# Patient Record
Sex: Female | Born: 1980 | Hispanic: No | Marital: Married | Smoking: Never smoker
Health system: Southern US, Community
[De-identification: ages and names within clinical notes are randomized; demographics above are authoritative.]

## PROBLEM LIST (undated history)

## (undated) ENCOUNTER — Inpatient Hospital Stay (HOSPITAL_COMMUNITY): Payer: Self-pay

## (undated) DIAGNOSIS — Z789 Other specified health status: Secondary | ICD-10-CM

## (undated) HISTORY — DX: Other specified health status: Z78.9

## (undated) HISTORY — PX: NO PAST SURGERIES: SHX2092

---

## 2008-01-29 ENCOUNTER — Encounter: Payer: Self-pay | Admitting: Obstetrics and Gynecology

## 2008-01-29 ENCOUNTER — Ambulatory Visit: Payer: Self-pay | Admitting: Obstetrics & Gynecology

## 2008-02-05 ENCOUNTER — Ambulatory Visit: Payer: Self-pay | Admitting: Obstetrics and Gynecology

## 2008-02-05 ENCOUNTER — Ambulatory Visit: Payer: Self-pay | Admitting: Obstetrics & Gynecology

## 2008-02-05 ENCOUNTER — Inpatient Hospital Stay (HOSPITAL_COMMUNITY): Admission: RE | Admit: 2008-02-05 | Discharge: 2008-02-07 | Payer: Self-pay | Admitting: Family Medicine

## 2008-02-25 ENCOUNTER — Inpatient Hospital Stay (HOSPITAL_COMMUNITY): Admission: AD | Admit: 2008-02-25 | Discharge: 2008-02-25 | Payer: Self-pay | Admitting: Gynecology

## 2008-02-25 ENCOUNTER — Ambulatory Visit: Payer: Self-pay | Admitting: *Deleted

## 2011-09-01 LAB — CBC
HCT: 31.2 — ABNORMAL LOW
Hemoglobin: 10.2 — ABNORMAL LOW
Platelets: 153
RBC: 4.19
RBC: 4.97
WBC: 10.6 — ABNORMAL HIGH
WBC: 8.4

## 2011-09-01 LAB — POCT URINALYSIS DIP (DEVICE)
Glucose, UA: NEGATIVE
Glucose, UA: NEGATIVE
Nitrite: NEGATIVE
Nitrite: NEGATIVE
Operator id: 148111
Operator id: 148111
Specific Gravity, Urine: 1.015
Specific Gravity, Urine: 1.025
Urobilinogen, UA: 0.2
Urobilinogen, UA: 0.2

## 2011-09-01 LAB — RPR: RPR Ser Ql: NONREACTIVE

## 2011-10-16 ENCOUNTER — Encounter (HOSPITAL_COMMUNITY): Payer: Self-pay | Admitting: *Deleted

## 2011-10-16 ENCOUNTER — Inpatient Hospital Stay (HOSPITAL_COMMUNITY)
Admission: AD | Admit: 2011-10-16 | Discharge: 2011-10-16 | Disposition: A | Discharge status: Home / Self Care | Payer: Medicaid Other | Source: Ambulatory Visit | Attending: Obstetrics & Gynecology | Admitting: Obstetrics & Gynecology

## 2011-10-16 DIAGNOSIS — R10814 Left lower quadrant abdominal tenderness: Secondary | ICD-10-CM

## 2011-10-16 DIAGNOSIS — O99891 Other specified diseases and conditions complicating pregnancy: Secondary | ICD-10-CM | POA: Insufficient documentation

## 2011-10-16 DIAGNOSIS — O0930 Supervision of pregnancy with insufficient antenatal care, unspecified trimester: Secondary | ICD-10-CM | POA: Insufficient documentation

## 2011-10-16 DIAGNOSIS — R1032 Left lower quadrant pain: Secondary | ICD-10-CM | POA: Insufficient documentation

## 2011-10-16 LAB — CBC
Hemoglobin: 9.7 g/dL — ABNORMAL LOW (ref 12.0–15.0)
MCH: 22.6 pg — ABNORMAL LOW (ref 26.0–34.0)
MCHC: 31.6 g/dL (ref 30.0–36.0)
Platelets: 131 10*3/uL — ABNORMAL LOW (ref 150–400)
RBC: 4.29 MIL/uL (ref 3.87–5.11)

## 2011-10-16 LAB — URINALYSIS, ROUTINE W REFLEX MICROSCOPIC
Bilirubin Urine: NEGATIVE
Specific Gravity, Urine: >1.03 — ABNORMAL HIGH (ref 1.005–1.030)
pH: 5.5 (ref 5.0–8.0)

## 2011-10-16 LAB — URINE MICROSCOPIC-ADD ON

## 2011-10-16 LAB — WET PREP, GENITAL
Clue Cells Wet Prep HPF POC: NONE SEEN
Trich, Wet Prep: NONE SEEN

## 2011-10-16 MED ORDER — ACETAMINOPHEN 500 MG PO TABS
1000.0000 mg | ORAL_TABLET | Freq: Four times a day (QID) | ORAL | Status: DC | PRN
  Administered 2011-10-16: 1000 mg via ORAL

## 2011-10-16 NOTE — Progress Notes (Signed)
Goes to the bathroom every 5 min.

## 2011-10-16 NOTE — Progress Notes (Signed)
Pain started last week.  So bad yesterday was unable to sleep.  Pain comes and goes, worse with movement.  Has not seen a dr.

## 2011-10-16 NOTE — ED Provider Notes (Signed)
History     Chief Complaint  Patient presents with  . Abdominal Pain   HPI  30 yo G3P2002 at 21.0 wk by LMP presents with left abdominal pain for one day. Started last evening in LLQ radiating to suprapubic/pubic area, prohibited pt from sleeping. Cannot get comfortable Rated 7-8/10, described as "a bowl of water." Worse with standing and walking.   Denies dysuria, hematuria, nausea, vomiting, vaginal bleeding, discharge, constipation, diarrhea, hematochezia.   Has had no PNC, recently immigrated from [place] 6 months ago.   OB History    Grav Para Term Preterm Abortions TAB SAB Ect Mult Living   3 2 2  0 0 0 0 0 0 2      History reviewed. No pertinent past medical history.  History reviewed. No pertinent past surgical history.  Family History  Problem Relation Age of Onset  . Hypertension Father   . Diabetes Father     History  Substance Use Topics  . Smoking status: Never Smoker   . Smokeless tobacco: Never Used  . Alcohol Use: No    Allergies: No Known Allergies  Prescriptions prior to admission  Medication Sig Dispense Refill  . prenatal vitamin w/FE, FA (PRENATAL 1 + 1) 27-1 MG TABS Take 1 tablet by mouth daily.          ROS Physical Exam   Blood pressure 108/69, pulse 88, temperature 98.9 F (37.2 C), temperature source Oral, resp. rate 20, height 5' 2.5" (1.588 m), weight 80.74 kg (178 lb), last menstrual period 05/22/2011.  Physical Exam  Vitals reviewed. Constitutional: She is oriented to person, place, and time. She appears well-developed and well-nourished. No distress.  HENT:  Head: Normocephalic and atraumatic.  Eyes: EOM are normal. Pupils are equal, round, and reactive to light.  Cardiovascular: Normal rate, regular rhythm, normal heart sounds and intact distal pulses.   No murmur heard. Respiratory: Effort normal and breath sounds normal. No respiratory distress. She has no wheezes. She has no rales.  GI: Soft.       Fundus at umbilical level,  nontender. Mild LLQ and suprapubic tenderness. No guarding, no rebound. BS ++.  Genitourinary: Vagina normal and uterus normal. No vaginal discharge found.       Cervix appears normal, no lesions. No cervical motion tenderness.  Musculoskeletal: She exhibits no edema and no tenderness.  Neurological: She is alert and oriented to person, place, and time. No cranial nerve deficit.    MAU Course  Procedures Lab Results  Component Value Date   WBC 8.4 10/16/2011   HGB 9.7* 10/16/2011   HCT 30.7* 10/16/2011   MCV 71.6* 10/16/2011   PLT 131* 10/16/2011   MDM: positional pain, no red flags of fever, rebound, negative UA and wet prep. GC/Chl pending. Case d/w Dr. Adrian Blackwater.  Assessment and Plan  30 yo G3P2002 at 21 weeks with round ligament pain, no PNC.  -recommend tylenol prn for ligamental pain.  -to establish at [facility] with Dr. [name] on 11/27, prenatal labs on 11/20.  Dionicia Cerritos 10/16/2011, 2:11 PM

## 2011-10-16 NOTE — ED Notes (Signed)
Dr. [name] scheduled appointment at [facility] for patient to begin prenatal care. Discussed with patient.

## 2011-10-17 LAB — GC/CHLAMYDIA PROBE AMP, GENITAL
Chlamydia, DNA Probe: NEGATIVE
GC Probe Amp, Genital: NEGATIVE

## 2011-10-31 ENCOUNTER — Other Ambulatory Visit: Payer: Self-pay

## 2011-11-01 ENCOUNTER — Other Ambulatory Visit: Payer: Self-pay

## 2011-11-01 DIAGNOSIS — Z331 Pregnant state, incidental: Secondary | ICD-10-CM

## 2011-11-01 NOTE — Progress Notes (Signed)
Prenatal labs done today Miranda Downs 

## 2011-11-02 LAB — HIV ANTIBODY (ROUTINE TESTING W REFLEX): HIV: NONREACTIVE

## 2011-11-02 LAB — OBSTETRIC PANEL
Antibody Screen: NEGATIVE
Basophils Relative: 0 % (ref 0–1)
Eosinophils Absolute: 0 10*3/uL (ref 0.0–0.7)
Eosinophils Relative: 0 % (ref 0–5)
Hemoglobin: 9.8 g/dL — ABNORMAL LOW (ref 12.0–15.0)
Lymphs Abs: 1.5 10*3/uL (ref 0.7–4.0)
MCH: 22.5 pg — ABNORMAL LOW (ref 26.0–34.0)
MCHC: 30.3 g/dL (ref 30.0–36.0)
MCV: 74.1 fL — ABNORMAL LOW (ref 78.0–100.0)
Monocytes Absolute: 0.4 10*3/uL (ref 0.1–1.0)
Monocytes Relative: 5 % (ref 3–12)
Neutrophils Relative %: 74 % (ref 43–77)
RBC: 4.36 MIL/uL (ref 3.87–5.11)
Rh Type: POSITIVE

## 2011-11-02 LAB — SICKLE CELL SCREEN: Sickle Cell Screen: POSITIVE — AB

## 2011-11-03 LAB — CULTURE, OB URINE: Colony Count: NO GROWTH

## 2011-11-07 ENCOUNTER — Ambulatory Visit (INDEPENDENT_AMBULATORY_CARE_PROVIDER_SITE_OTHER): Payer: Self-pay | Admitting: Family Medicine

## 2011-11-07 ENCOUNTER — Encounter: Payer: Self-pay | Admitting: Family Medicine

## 2011-11-07 ENCOUNTER — Other Ambulatory Visit (HOSPITAL_COMMUNITY)
Admission: RE | Admit: 2011-11-07 | Discharge: 2011-11-07 | Disposition: A | Discharge status: Home / Self Care | Payer: Medicaid Other | Source: Ambulatory Visit | Attending: Family Medicine | Admitting: Family Medicine

## 2011-11-07 VITALS — BP 112/86 | Temp 99.2°F | Wt 171.3 lb

## 2011-11-07 DIAGNOSIS — D573 Sickle-cell trait: Secondary | ICD-10-CM

## 2011-11-07 DIAGNOSIS — Z01419 Encounter for gynecological examination (general) (routine) without abnormal findings: Secondary | ICD-10-CM | POA: Insufficient documentation

## 2011-11-07 DIAGNOSIS — Z3490 Encounter for supervision of normal pregnancy, unspecified, unspecified trimester: Secondary | ICD-10-CM | POA: Insufficient documentation

## 2011-11-07 DIAGNOSIS — D509 Iron deficiency anemia, unspecified: Secondary | ICD-10-CM | POA: Insufficient documentation

## 2011-11-07 DIAGNOSIS — Z3480 Encounter for supervision of other normal pregnancy, unspecified trimester: Secondary | ICD-10-CM

## 2011-11-07 DIAGNOSIS — Z23 Encounter for immunization: Secondary | ICD-10-CM

## 2011-11-07 NOTE — Progress Notes (Signed)
   Subjective:    Miranda Downs is a [id number] [redacted]w[redacted]d being seen today for her first obstetrical visit.  Her obstetrical history is significant for late to care. Patient does intend to breast feed. Pregnancy history fully reviewed.  Patient reports backache, no bleeding, no contractions and no leaking. Has normal fetal movement. Is anxious to determine sex of baby.  Filed Vitals:   11/07/11 1449  BP: 112/86  Temp: 99.2 F (37.3 C)  Weight: 171 lb 4.8 oz (77.701 kg)    HISTORY: OB History    Grav Para Term Preterm Abortions TAB SAB Ect Mult Living   3 2 2  0 0 0 0 0 0 2     # Outc Date GA Lbr Len/2nd Wgt Sex Del Anes PTL Lv   1 TRM 10/06    M SVD   Yes   2 TRM 2/09    M SVD      3 CUR              Past Medical History  Diagnosis Date  . No pertinent past medical history    Past Surgical History  Procedure Date  . No past surgeries    Family History  Problem Relation Age of Onset  . Hypertension Father   . Diabetes Father      Exam    Uterine Size: 23 cm  Pelvic Exam:    Perineum: No Hemorrhoids, Normal Perineum   Vulva: normal   Vagina:  normal mucosa, pap done       Cervix: no bleeding following Pap and retroverted   Adnexa: normal adnexa   Bony Pelvis: gynecoid   Neurologic: oriented, normal, normal mood, no focal deficits   Extremities: normal strength, tone, and muscle mass   HEENT PERRLA and extra ocular movement intact   Mouth/Teeth mucous membranes moist, pharynx normal without lesions   Neck supple   Cardiovascular: regular rate and rhythm   Respiratory:  appears well, vitals normal, no respiratory distress, acyanotic, normal RR, chest clear, no wheezing, crepitations, rhonchi, normal symmetric air entry   Abdomen: soft, non-tender; bowel sounds normal; no masses,  no organomegaly   Urinary:       Assessment:    Pregnancy: [id number] Patient Active Problem List  Diagnoses  . Supervision of normal pregnancy  . Sickle cell trait        Plan:       Initial labs drawn-Hgb 9.8 and positive sickle cell trait noted. No family history of disease and two sons are well. Father does not have family history. Recommend father to have testing to determine if genetic counseling is appropriate. Prenatal vitamins. Recommend iron supplementation. Genetic Screening-too late   Ultrasound discussed; fetal survey: requested. Ordered for anatomy and dating but will await medicaid finalization in next 7-10 days prior to visit.  Follow up in 3 weeks for 1hr glucola and ROB.  Flu vaccine today. 50% of 50 min visit spent on counseling and coordination of care.    Lloyd Huger 11/07/2011

## 2011-11-07 NOTE — Patient Instructions (Addendum)
You have sickle cell trait. Ask Abdrahim about his family history as this may be important for the baby. Make an appointment in 3 weeks for check up. Will order your ultrasound for baby. Take iron tablets or make sure your vitamin has iron in it.   Sickle Cell Anemia Sickle cell disease is a general term for hemoglobin abnormalities in which the sickle gene is inherited from at least one parent. Sickle cell anemia or hemoglobin SS disease is the condition in which the sickle gene is inherited from both parents. A smaller number have hemoglobin SS disease. Sickle cell hemoglobin causes the red blood cells to become deformed and break up. There are a number of medical problems that happen in people with SS disease. These include:  Pain crisis. This is caused by small blood vessels being blocked. This is due to infection or dehydration. There is usually severe pain in the muscles, joints, back, or stomach. Treatment may include IV fluids, transfusion, and pain medicine. Hospital care is often needed.   Severe anemia. This often follows an infection. Symptoms include severe shortness of breath, weakness, and shock. Transfusions are needed to correct the problem.   Complications. People with sickle cell disease are more likely to get infections, strokes, eye problems, and heart failure.

## 2011-11-13 ENCOUNTER — Ambulatory Visit (HOSPITAL_COMMUNITY)
Admission: RE | Admit: 2011-11-13 | Discharge: 2011-11-13 | Disposition: A | Discharge status: Home / Self Care | Payer: Medicaid Other | Source: Ambulatory Visit | Attending: Family Medicine | Admitting: Family Medicine

## 2011-11-13 DIAGNOSIS — Z3490 Encounter for supervision of normal pregnancy, unspecified, unspecified trimester: Secondary | ICD-10-CM

## 2011-11-13 DIAGNOSIS — Z363 Encounter for antenatal screening for malformations: Secondary | ICD-10-CM | POA: Insufficient documentation

## 2011-11-13 DIAGNOSIS — Z1389 Encounter for screening for other disorder: Secondary | ICD-10-CM | POA: Insufficient documentation

## 2011-11-13 DIAGNOSIS — O358XX0 Maternal care for other (suspected) fetal abnormality and damage, not applicable or unspecified: Secondary | ICD-10-CM | POA: Insufficient documentation

## 2011-11-13 NOTE — Progress Notes (Signed)
Note reviewed.  Agree with plan. Will need early glucola for +FH diabetes (father). Late to care.

## 2011-11-14 ENCOUNTER — Telehealth: Payer: Self-pay | Admitting: *Deleted

## 2011-11-14 NOTE — Telephone Encounter (Signed)
Called and asked pt if she could come in to have 1 hour GTT done. She stated that she could not make the appt at this time that she would need to check with her husband so that he can bring her. I told her to just call back once she could make those arrangements. Pt agreed and will call back to schedule a lab appt.Loralee Pacas [place]

## 2011-11-17 ENCOUNTER — Other Ambulatory Visit: Payer: Self-pay | Admitting: Family Medicine

## 2011-11-17 ENCOUNTER — Other Ambulatory Visit: Payer: Self-pay

## 2011-11-17 NOTE — Progress Notes (Signed)
1 hr [place] glucose done on 50 g glucose Pt passed with 126 mg/dl Dewitt Hoes, MLS

## 2011-11-17 NOTE — Progress Notes (Signed)
1 hr gtt done today Miranda Downs 

## 2011-12-12 NOTE — L&D Delivery Note (Signed)
Attestation of Attending Supervision of Advanced Practitioner: Evaluation and management procedures were performed by the PA/NP/CNM/OB Fellow under my supervision/collaboration. Chart reviewed and agree with management and plan.  Treavor Blomquist V 07/27/2012 2:16 AM

## 2011-12-12 NOTE — L&D Delivery Note (Signed)
Delivery Note At 5:55 PM a viable and healthy female was delivered via  (Presentation: ROP). Loose nuchal cord x1 reduced without difficulty. APGAR:8,9; weight 7# 4oz  .   Placenta status:intact, spontaneous.  Cord: 3 vessel without complication.  Cord pH: Not indicated  Anesthesia:  None Episiotomy: None Lacerations: None Suture Repair: N/A Est. Blood Loss (mL): 250 ml  Mom to postpartum.  Baby to nursery-stable.  S/O at bedside and family bonding well.  Paged Miranda Huger, MD, immediately prior to delivery.  LEFTWICH-KIRBY, Miranda Downs 02/18/2012, 6:18 PM   Attending: Christin Bach, MD

## 2011-12-15 ENCOUNTER — Ambulatory Visit (INDEPENDENT_AMBULATORY_CARE_PROVIDER_SITE_OTHER): Payer: Medicaid Other | Admitting: Family Medicine

## 2011-12-15 ENCOUNTER — Telehealth: Payer: Self-pay | Admitting: Family Medicine

## 2011-12-15 VITALS — BP 112/76 | Temp 98.5°F | Wt 179.0 lb

## 2011-12-15 DIAGNOSIS — Z3490 Encounter for supervision of normal pregnancy, unspecified, unspecified trimester: Secondary | ICD-10-CM

## 2011-12-15 DIAGNOSIS — Z3480 Encounter for supervision of other normal pregnancy, unspecified trimester: Secondary | ICD-10-CM

## 2011-12-15 DIAGNOSIS — O2240 Hemorrhoids in pregnancy, unspecified trimester: Secondary | ICD-10-CM | POA: Insufficient documentation

## 2011-12-15 DIAGNOSIS — N760 Acute vaginitis: Secondary | ICD-10-CM

## 2011-12-15 DIAGNOSIS — O228X9 Other venous complications in pregnancy, unspecified trimester: Secondary | ICD-10-CM

## 2011-12-15 MED ORDER — HYDROCORTISONE 2.5 % RE CREA: TOPICAL_CREAM | Freq: Two times a day (BID) | RECTAL | Status: DC

## 2011-12-15 MED ORDER — POLYETHYLENE GLYCOL 3350 17 GM/SCOOP PO POWD: 17.0000 g | Freq: Every day | ORAL | Status: AC

## 2011-12-15 MED ORDER — FLUCONAZOLE 150 MG PO TABS: 150.0000 mg | ORAL_TABLET | Freq: Once | ORAL | Status: AC

## 2011-12-15 NOTE — Patient Instructions (Addendum)
Continue taking prenatal vitamins. Start taking miralax (fiber supplements) to soften stool. Will send medication to pharmacy. Make an appointment in 2 weeks with OB clinic (Dr. Ardine Bjork physician only). Will check labs today.  Pregnancy - Third Trimester The third trimester of pregnancy (the last 3 months) is a period of the most rapid growth for you and your baby. The baby approaches a length of 20 inches and a weight of 6 to 10 pounds. The baby is adding on fat and getting ready for life outside your body. While inside, babies have periods of sleeping and waking, suck their thumbs, and hiccups. You can often feel small contractions of the uterus. This is false labor. It is also called Braxton-Hicks contractions. This is like a practice for labor. The usual problems in this stage of pregnancy include more difficulty breathing, swelling of the hands and feet from water retention, and having to urinate more often because of the uterus and baby pressing on your bladder.  PRENATAL EXAMS  Blood work may continue to be done during prenatal exams. These tests are done to check on your health and the probable health of your baby. Blood work is used to follow your blood levels (hemoglobin). Anemia (low hemoglobin) is common during pregnancy. Iron and vitamins are given to help prevent this. You may also continue to be checked for diabetes. Some of the past blood tests may be done again.   The size of the uterus is measured during each visit. This makes sure your baby is growing properly according to your pregnancy dates.   Your blood pressure is checked every prenatal visit. This is to make sure you are not getting toxemia.   Your urine is checked every prenatal visit for infection, diabetes and protein.   Your weight is checked at each visit. This is done to make sure gains are happening at the suggested rate and that you and your baby are growing normally.   Sometimes, an ultrasound is performed  to confirm the position and the proper growth and development of the baby. This is a test done that bounces harmless sound waves off the baby so your caregiver can more accurately determine due dates.   Discuss the type of pain medication and anesthesia you will have during your labor and delivery.   Discuss the possibility and anesthesia if a Cesarean Section might be necessary.   Inform your caregiver if there is any mental or physical violence at home.  Sometimes, a specialized non-stress test, contraction stress test and biophysical profile are done to make sure the baby is not having a problem. Checking the amniotic fluid surrounding the baby is called an amniocentesis. The amniotic fluid is removed by sticking a needle into the belly (abdomen). This is sometimes done near the end of pregnancy if an early delivery is required. In this case, it is done to help make sure the baby's lungs are mature enough for the baby to live outside of the womb. If the lungs are not mature and it is unsafe to deliver the baby, an injection of cortisone medication is given to the mother 1 to 2 days before the delivery. This helps the baby's lungs mature and makes it safer to deliver the baby. CHANGES OCCURING IN THE THIRD TRIMESTER OF PREGNANCY Your body goes through many changes during pregnancy. They vary from person to person. Talk to your caregiver about changes you notice and are concerned about.  During the last trimester, you have probably had an  increase in your appetite. It is normal to have cravings for certain foods. This varies from person to person and pregnancy to pregnancy.   You may begin to get stretch marks on your hips, abdomen, and breasts. These are normal changes in the body during pregnancy. There are no exercises or medications to take which prevent this change.   Constipation may be treated with a stool softener or adding bulk to your diet. Drinking lots of fluids, fiber in vegetables,  fruits, and whole grains are helpful.   Exercising is also helpful. If you have been very active up until your pregnancy, most of these activities can be continued during your pregnancy. If you have been less active, it is helpful to start an exercise program such as walking. Consult your caregiver before starting exercise programs.   Avoid all smoking, alcohol, un-prescribed drugs, herbs and "street drugs" during your pregnancy. These chemicals affect the formation and growth of the baby. Avoid chemicals throughout the pregnancy to ensure the delivery of a healthy infant.   Backache, varicose veins and hemorrhoids may develop or get worse.   You will tire more easily in the third trimester, which is normal.   The baby's movements may be stronger and more often.   You may become short of breath easily.   Your belly button may stick out.   A yellow discharge may leak from your breasts called colostrum.   You may have a bloody mucus discharge. This usually occurs a few days to a week before labor begins.  HOME CARE INSTRUCTIONS   Keep your caregiver's appointments. Follow your caregiver's instructions regarding medication use, exercise, and diet.   During pregnancy, you are providing food for you and your baby. Continue to eat regular, well-balanced meals. Choose foods such as meat, fish, milk and other low fat dairy products, vegetables, fruits, and whole-grain breads and cereals. Your caregiver will tell you of the ideal weight gain.   A physical sexual relationship may be continued throughout pregnancy if there are no other problems such as early (premature) leaking of amniotic fluid from the membranes, vaginal bleeding, or belly (abdominal) pain.   Exercise regularly if there are no restrictions. Check with your caregiver if you are unsure of the safety of your exercises. Greater weight gain will occur in the last 2 trimesters of pregnancy. Exercising helps:   Control your weight.    Get you in shape for labor and delivery.   You lose weight after you deliver.   Rest a lot with legs elevated, or as needed for leg cramps or low back pain.   Wear a good support or jogging bra for breast tenderness during pregnancy. This may help if worn during sleep. Pads or tissues may be used in the bra if you are leaking colostrum.   Do not use hot tubs, steam rooms, or saunas.   Wear your seat belt when driving. This protects you and your baby if you are in an accident.   Avoid raw meat, cat litter boxes and soil used by cats. These carry germs that can cause birth defects in the baby.   It is easier to loose urine during pregnancy. Tightening up and strengthening the pelvic muscles will help with this problem. You can practice stopping your urination while you are going to the bathroom. These are the same muscles you need to strengthen. It is also the muscles you would use if you were trying to stop from passing gas. You can practice  tightening these muscles up 10 times a set and repeating this about 3 times per day. Once you know what muscles to tighten up, do not perform these exercises during urination. It is more likely to cause an infection by backing up the urine.   Ask for help if you have financial, counseling or nutritional needs during pregnancy. Your caregiver will be able to offer counseling for these needs as well as refer you for other special needs.   Make a list of emergency phone numbers and have them available.   Plan on getting help from family or friends when you go home from the hospital.   Make a trial run to the hospital.   Take prenatal classes with the father to understand, practice and ask questions about the labor and delivery.   Prepare the baby's room/nursery.   Do not travel out of the city unless it is absolutely necessary and with the advice of your caregiver.   Wear only low or no heal shoes to have better balance and prevent falling.   MEDICATIONS AND DRUG USE IN PREGNANCY  Take prenatal vitamins as directed. The vitamin should contain 1 milligram of folic acid. Keep all vitamins out of reach of children. Only a couple vitamins or tablets containing iron may be fatal to a baby or young child when ingested.   Avoid use of all medications, including herbs, over-the-counter medications, not prescribed or suggested by your caregiver. Only take over-the-counter or prescription medicines for pain, discomfort, or fever as directed by your caregiver. Do not use aspirin, ibuprofen (Motrin, Advil, Nuprin) or naproxen (Aleve) unless OK'd by your caregiver.   Let your caregiver also know about herbs you may be using.   Alcohol is related to a number of birth defects. This includes fetal alcohol syndrome. All alcohol, in any form, should be avoided completely. Smoking will cause low birth rate and premature babies.   Street/illegal drugs are very harmful to the baby. They are absolutely forbidden. A baby born to an addicted mother will be addicted at birth. The baby will go through the same withdrawal an adult does.  SEEK MEDICAL CARE IF: You have any concerns or worries during your pregnancy. It is better to call with your questions if you feel they cannot wait, rather than worry about them. DECISIONS ABOUT CIRCUMCISION You may or may not know the sex of your baby. If you know your baby is a boy, it may be time to think about circumcision. Circumcision is the removal of the foreskin of the penis. This is the skin that covers the sensitive end of the penis. There is no proven medical need for this. Often this decision is made on what is popular at the time or based upon religious beliefs and social issues. You can discuss these issues with your caregiver or pediatrician. SEEK IMMEDIATE MEDICAL CARE IF:   An unexplained oral temperature above 102 F (38.9 C) develops, or as your caregiver suggests.   You have leaking of fluid from the  vagina (birth canal). If leaking membranes are suspected, take your temperature and tell your caregiver of this when you call.   There is vaginal spotting, bleeding or passing clots. Tell your caregiver of the amount and how many pads are used.   You develop a bad smelling vaginal discharge with a change in the color from clear to white.   You develop vomiting that lasts more than 24 hours.   You develop chills or fever.  You develop shortness of breath.   You develop burning on urination.   You loose more than 2 pounds of weight or gain more than 2 pounds of weight or as suggested by your caregiver.   You notice sudden swelling of your face, hands, and feet or legs.   You develop belly (abdominal) pain. Round ligament discomfort is a common non-cancerous (benign) cause of abdominal pain in pregnancy. Your caregiver still must evaluate you.   You develop a severe headache that does not go away.   You develop visual problems, blurred or double vision.   If you have not felt your baby move for more than 1 hour. If you think the baby is not moving as much as usual, eat something with sugar in it and lie down on your left side for an hour. The baby should move at least 4 to 5 times per hour. Call right away if your baby moves less than that.   You fall, are in a car accident or any kind of trauma.   There is mental or physical violence at home.  Document Released: 11/21/2001 Document Revised: 08/09/2011 Document Reviewed: 05/26/2009 [facility] Patient Information 2012 [place], [place].

## 2011-12-15 NOTE — Telephone Encounter (Signed)
Forwarded to Dr. [name].Loralee Pacas [place]

## 2011-12-15 NOTE — Progress Notes (Signed)
29.4 wk ROB visit. C/o hemorrhoids-pain with sitting and especially defecating. One BM daily. Has not tried any medications. Also having some vaginal/labial itching. Denies any discharge, bleeding, abdominal pain. [place] reviewed: normal female anatomy, good dating by LMP. Weight gain modest since last visit.  A/P: G3P2 progressing well to third trimester. Discussed vitamin use daily, weight gain appropriate. HIV, CBC, RPR today. Trial of anusol for hemorrhoids and fluconazole x1 for likely yeast vaginitis. Discussed increase fiber intake, starting miralax daily. F/u in 2 weeks in OB clinic.

## 2011-12-15 NOTE — Telephone Encounter (Signed)
Needs a letter stating that her mother needs to come and help her with taking care of the children because of her pregnancy.  [place] mother [place] and cannot come with out a letter of medical necessity.

## 2011-12-16 LAB — CBC
HCT: 31.5 % — ABNORMAL LOW (ref 36.0–46.0)
MCH: 22.8 pg — ABNORMAL LOW (ref 26.0–34.0)
MCHC: 31.1 g/dL (ref 30.0–36.0)
MCV: 73.4 fL — ABNORMAL LOW (ref 78.0–100.0)
Platelets: 143 10*3/uL — ABNORMAL LOW (ref 150–400)
RDW: 16.5 % — ABNORMAL HIGH (ref 11.5–15.5)
WBC: 7.2 10*3/uL (ref 4.0–10.5)

## 2011-12-18 ENCOUNTER — Encounter: Payer: Self-pay | Admitting: Family Medicine

## 2011-12-18 ENCOUNTER — Other Ambulatory Visit: Payer: Self-pay | Admitting: Family Medicine

## 2011-12-18 ENCOUNTER — Telehealth: Payer: Self-pay | Admitting: Family Medicine

## 2011-12-18 DIAGNOSIS — O2240 Hemorrhoids in pregnancy, unspecified trimester: Secondary | ICD-10-CM

## 2011-12-18 MED ORDER — DIBUCAINE 1 % RE OINT: 1.0000 "application " | TOPICAL_OINTMENT | Freq: Three times a day (TID) | RECTAL | Status: DC | PRN

## 2011-12-18 MED ORDER — HYDROCORTISONE 2.5 % RE CREA: TOPICAL_CREAM | Freq: Two times a day (BID) | RECTAL | Status: AC

## 2011-12-18 NOTE — Telephone Encounter (Signed)
Requests refill for hemorrhoid cream. I have sent this and recommend OTC anelgesic.

## 2011-12-18 NOTE — Telephone Encounter (Signed)
States that she got fluconazole and is still in need of refill - has not cleared up discharge. [facility] [address]

## 2011-12-18 NOTE — Telephone Encounter (Signed)
Patient requests letter to attempt her mother to come with a VISA to help take care of children. I do not have any medical evidence this is necessary but I agreed to provide letter stating her due date and request. May pick up at front desk.

## 2011-12-19 ENCOUNTER — Telehealth: Payer: Self-pay | Admitting: *Deleted

## 2011-12-19 NOTE — Telephone Encounter (Signed)
Received a message from patients OB Case Manager asking if Dr. [name] could prescribe/order patient a special pillow that has a hole in in for her painful hemorrhoids.  Also wanting to let Dr. [name] know that patient's mother's name is [facility].  She may need this information for the letter she is writing on patient's behalf.   They are hoping that patient's mother can come immediately and stay for at least 6 months.  Once letter is written, Bonita Quin (OB CM) should be notified and she can pick the letter up and deliver it to patient.  Colette Ribas I would pass along this message.

## 2011-12-19 NOTE — Telephone Encounter (Signed)
I spoke with patient and her husband on Monday. Letter describing her situation has been left at front desk. I will place a script for pillow tomorrow afternoon, plan to leave at front desk also. Ready for pickup tomorrow after 2 pm.

## 2011-12-20 NOTE — Telephone Encounter (Signed)
Miranda Downs notified via email that letter was ready for pick up.  It is in my office.

## 2011-12-20 NOTE — Telephone Encounter (Signed)
[facility] notified that Rx for donut pillow ready at front desk to pick up.  Will pick up tomorrow.  Gaylene Brooks, RN

## 2011-12-29 ENCOUNTER — Ambulatory Visit (INDEPENDENT_AMBULATORY_CARE_PROVIDER_SITE_OTHER): Payer: Medicaid Other | Admitting: Family Medicine

## 2011-12-29 VITALS — BP 108/71 | Temp 98.0°F | Wt 181.1 lb

## 2011-12-29 DIAGNOSIS — Z3490 Encounter for supervision of normal pregnancy, unspecified, unspecified trimester: Secondary | ICD-10-CM

## 2011-12-29 DIAGNOSIS — Z3480 Encounter for supervision of other normal pregnancy, unspecified trimester: Secondary | ICD-10-CM

## 2011-12-29 MED ORDER — PRENATAL PLUS 27-1 MG PO TABS: 1.0000 | ORAL_TABLET | Freq: Every day | ORAL | Status: DC

## 2011-12-29 NOTE — Progress Notes (Signed)
31.4 ROB visit. Interpretor present at interview. Hemorrhoids have resolved, had some spotting on toilet tissue last week from rectum. No pain currently. Taking PNV. Having some mild low back pain and difficulty sleeping otherwise everything is going well at home. Patient is very excited about being pregnant.   A/P: G3P2 at 31.4 weeks with normal pregnancy. Discuss PTL precautions, fetal activity, wt gain. Pt desires to breastfeed but failed with last pregnancy. Will f/u in 2 weeks in OB clinic. Will obtain cervical cultures and GBS at 36 wk visit.

## 2011-12-29 NOTE — Patient Instructions (Addendum)
Nice to see you again. Make sure to take vitamins and eat vegetables, fruits. Make an appointment with Dr. Swaziland in [facility] clinic in 2 weeks. Make an appointment with Dr. [name] two weeks after you see Dr. Swaziland.   Pregnancy - Third Trimester The third trimester of pregnancy (the last 3 months) is a period of the most rapid growth for you and your baby. The baby approaches a length of 20 inches and a weight of 6 to 10 pounds. The baby is adding on fat and getting ready for life outside your body. While inside, babies have periods of sleeping and waking, suck their thumbs, and hiccups. You can often feel small contractions of the uterus. This is false labor. It is also called Braxton-Hicks contractions. This is like a practice for labor. The usual problems in this stage of pregnancy include more difficulty breathing, swelling of the hands and feet from water retention, and having to urinate more often because of the uterus and baby pressing on your bladder.  PRENATAL EXAMS  Blood work may continue to be done during prenatal exams. These tests are done to check on your health and the probable health of your baby. Blood work is used to follow your blood levels (hemoglobin). Anemia (low hemoglobin) is common during pregnancy. Iron and vitamins are given to help prevent this. You may also continue to be checked for diabetes. Some of the past blood tests may be done again.   The size of the uterus is measured during each visit. This makes sure your baby is growing properly according to your pregnancy dates.   Your blood pressure is checked every prenatal visit. This is to make sure you are not getting toxemia.   Your urine is checked every prenatal visit for infection, diabetes and protein.   Your weight is checked at each visit. This is done to make sure gains are happening at the suggested rate and that you and your baby are growing normally.   Sometimes, an ultrasound is performed to confirm the  position and the proper growth and development of the baby. This is a test done that bounces harmless sound waves off the baby so your caregiver can more accurately determine due dates.   Discuss the type of pain medication and anesthesia you will have during your labor and delivery.   Discuss the possibility and anesthesia if a Cesarean Section might be necessary.   Inform your caregiver if there is any mental or physical violence at home.  Sometimes, a specialized non-stress test, contraction stress test and biophysical profile are done to make sure the baby is not having a problem. Checking the amniotic fluid surrounding the baby is called an amniocentesis. The amniotic fluid is removed by sticking a needle into the belly (abdomen). This is sometimes done near the end of pregnancy if an early delivery is required. In this case, it is done to help make sure the baby's lungs are mature enough for the baby to live outside of the womb. If the lungs are not mature and it is unsafe to deliver the baby, an injection of cortisone medication is given to the mother 1 to 2 days before the delivery. This helps the baby's lungs mature and makes it safer to deliver the baby. CHANGES OCCURING IN THE THIRD TRIMESTER OF PREGNANCY Your body goes through many changes during pregnancy. They vary from person to person. Talk to your caregiver about changes you notice and are concerned about.  During the last  trimester, you have probably had an increase in your appetite. It is normal to have cravings for certain foods. This varies from person to person and pregnancy to pregnancy.   You may begin to get stretch marks on your hips, abdomen, and breasts. These are normal changes in the body during pregnancy. There are no exercises or medications to take which prevent this change.   Constipation may be treated with a stool softener or adding bulk to your diet. Drinking lots of fluids, fiber in vegetables, fruits, and whole  grains are helpful.   Exercising is also helpful. If you have been very active up until your pregnancy, most of these activities can be continued during your pregnancy. If you have been less active, it is helpful to start an exercise program such as walking. Consult your caregiver before starting exercise programs.   Avoid all smoking, alcohol, un-prescribed drugs, herbs and "street drugs" during your pregnancy. These chemicals affect the formation and growth of the baby. Avoid chemicals throughout the pregnancy to ensure the delivery of a healthy infant.   Backache, varicose veins and hemorrhoids may develop or get worse.   You will tire more easily in the third trimester, which is normal.   The baby's movements may be stronger and more often.   You may become short of breath easily.   Your belly button may stick out.   A yellow discharge may leak from your breasts called colostrum.   You may have a bloody mucus discharge. This usually occurs a few days to a week before labor begins.  HOME CARE INSTRUCTIONS   Keep your caregiver's appointments. Follow your caregiver's instructions regarding medication use, exercise, and diet.   During pregnancy, you are providing food for you and your baby. Continue to eat regular, well-balanced meals. Choose foods such as meat, fish, milk and other low fat dairy products, vegetables, fruits, and whole-grain breads and cereals. Your caregiver will tell you of the ideal weight gain.   A physical sexual relationship may be continued throughout pregnancy if there are no other problems such as early (premature) leaking of amniotic fluid from the membranes, vaginal bleeding, or belly (abdominal) pain.   Exercise regularly if there are no restrictions. Check with your caregiver if you are unsure of the safety of your exercises. Greater weight gain will occur in the last 2 trimesters of pregnancy. Exercising helps:   Control your weight.   Get you in shape  for labor and delivery.   You lose weight after you deliver.   Rest a lot with legs elevated, or as needed for leg cramps or low back pain.   Wear a good support or jogging bra for breast tenderness during pregnancy. This may help if worn during sleep. Pads or tissues may be used in the bra if you are leaking colostrum.   Do not use hot tubs, steam rooms, or saunas.   Wear your seat belt when driving. This protects you and your baby if you are in an accident.   Avoid raw meat, cat litter boxes and soil used by cats. These carry germs that can cause birth defects in the baby.   It is easier to loose urine during pregnancy. Tightening up and strengthening the pelvic muscles will help with this problem. You can practice stopping your urination while you are going to the bathroom. These are the same muscles you need to strengthen. It is also the muscles you would use if you were trying to stop  from passing gas. You can practice tightening these muscles up 10 times a set and repeating this about 3 times per day. Once you know what muscles to tighten up, do not perform these exercises during urination. It is more likely to cause an infection by backing up the urine.   Ask for help if you have financial, counseling or nutritional needs during pregnancy. Your caregiver will be able to offer counseling for these needs as well as refer you for other special needs.   Make a list of emergency phone numbers and have them available.   Plan on getting help from family or friends when you go home from the hospital.   Make a trial run to the hospital.   Take prenatal classes with the father to understand, practice and ask questions about the labor and delivery.   Prepare the baby's room/nursery.   Do not travel out of the city unless it is absolutely necessary and with the advice of your caregiver.   Wear only low or no heal shoes to have better balance and prevent falling.  MEDICATIONS AND DRUG USE IN  PREGNANCY  Take prenatal vitamins as directed. The vitamin should contain 1 milligram of folic acid. Keep all vitamins out of reach of children. Only a couple vitamins or tablets containing iron may be fatal to a baby or young child when ingested.   Avoid use of all medications, including herbs, over-the-counter medications, not prescribed or suggested by your caregiver. Only take over-the-counter or prescription medicines for pain, discomfort, or fever as directed by your caregiver. Do not use aspirin, ibuprofen (Motrin, Advil, Nuprin) or naproxen (Aleve) unless OK'd by your caregiver.   Let your caregiver also know about herbs you may be using.   Alcohol is related to a number of birth defects. This includes fetal alcohol syndrome. All alcohol, in any form, should be avoided completely. Smoking will cause low birth rate and premature babies.   Street/illegal drugs are very harmful to the baby. They are absolutely forbidden. A baby born to an addicted mother will be addicted at birth. The baby will go through the same withdrawal an adult does.  SEEK MEDICAL CARE IF: You have any concerns or worries during your pregnancy. It is better to call with your questions if you feel they cannot wait, rather than worry about them. DECISIONS ABOUT CIRCUMCISION You may or may not know the sex of your baby. If you know your baby is a boy, it may be time to think about circumcision. Circumcision is the removal of the foreskin of the penis. This is the skin that covers the sensitive end of the penis. There is no proven medical need for this. Often this decision is made on what is popular at the time or based upon religious beliefs and social issues. You can discuss these issues with your caregiver or pediatrician. SEEK IMMEDIATE MEDICAL CARE IF:   An unexplained oral temperature above 102 F (38.9 C) develops, or as your caregiver suggests.   You have leaking of fluid from the vagina (birth canal). If  leaking membranes are suspected, take your temperature and tell your caregiver of this when you call.   There is vaginal spotting, bleeding or passing clots. Tell your caregiver of the amount and how many pads are used.   You develop a bad smelling vaginal discharge with a change in the color from clear to white.   You develop vomiting that lasts more than 24 hours.   You  develop chills or fever.   You develop shortness of breath.   You develop burning on urination.   You loose more than 2 pounds of weight or gain more than 2 pounds of weight or as suggested by your caregiver.   You notice sudden swelling of your face, hands, and feet or legs.   You develop belly (abdominal) pain. Round ligament discomfort is a common non-cancerous (benign) cause of abdominal pain in pregnancy. Your caregiver still must evaluate you.   You develop a severe headache that does not go away.   You develop visual problems, blurred or double vision.   If you have not felt your baby move for more than 1 hour. If you think the baby is not moving as much as usual, eat something with sugar in it and lie down on your left side for an hour. The baby should move at least 4 to 5 times per hour. Call right away if your baby moves less than that.   You fall, are in a car accident or any kind of trauma.   There is mental or physical violence at home.  Document Released: 11/21/2001 Document Revised: 08/09/2011 Document Reviewed: 05/26/2009 [facility] Patient Information 2012 [place], [place].

## 2012-01-11 ENCOUNTER — Ambulatory Visit (INDEPENDENT_AMBULATORY_CARE_PROVIDER_SITE_OTHER): Payer: Medicaid Other | Admitting: Family Medicine

## 2012-01-11 VITALS — BP 98/68 | Temp 98.3°F | Wt 182.0 lb

## 2012-01-11 DIAGNOSIS — Z3490 Encounter for supervision of normal pregnancy, unspecified, unspecified trimester: Secondary | ICD-10-CM

## 2012-01-11 DIAGNOSIS — Z3480 Encounter for supervision of other normal pregnancy, unspecified trimester: Secondary | ICD-10-CM

## 2012-01-11 NOTE — Patient Instructions (Signed)
OK to use tylenol/acetaminophen for aches and pains. You can use up to 1000mg  at one time.   Make an appointment in 2 weeks with OB clinic (Dr. Ardine Bjork only). Try to keep drinking plenty of fluids, hot tea, gatorade, other drinks.   Viral and Bacterial Pharyngitis Pharyngitis is a sore throat. It is an infection of the back of the throat (pharynx). HOME CARE   Only take medicine as told by your doctor. You may get sick again if you do not take medicine as told.   Drink enough fluids to keep your pee (urine) clear or pale yellow.   Rest.   Rinse your mouth (gargle) with salt water ( teaspoon of salt in 8 ounces of water) every 1 to 2 hours. This will help the pain.   For children over the age of 7, suck on hard candy or sore throat lozenges.  GET HELP RIGHT AWAY IF:   There are large, tender lumps in your neck.   You have a rash.   You cough up green, yellow-brown, or bloody mucus.   You have a stiff neck.   There is redness, puffiness (swelling), or very bad pain anywhere on the neck.   You drool or are unable to swallow liquids.   You throw up (vomit) or are not able to keep medicine or liquids down.   You have very bad pain that will not stop with medicine.   You have problems breathing (not from a stuffy nose).   You cannot open your mouth completely.   You or your child has a temperature by mouth above 102 F (38.9 C), not controlled by medicine.   Your baby is older than 3 months with a rectal temperature of 102 F (38.9 C) or higher.   Your baby is 105 months old or younger with a rectal temperature of 100.4 F (38 C) or higher.  MAKE SURE YOU:   Understand these instructions.   Will watch this condition.   Will get help right away if you or your child is not doing well or gets worse.  Document Released: 05/15/2008 Document Revised: 08/09/2011 Document Reviewed: 12/27/2009 [facility] Patient Information 2012 [place], [place].

## 2012-01-12 NOTE — Progress Notes (Signed)
33.4 wk ROB visit. Patient having some lower abdominal and low back discomfort, she thinks baby is descending. This is intermittent. Hasn't taken any meds. She admits that 2 weeks ago, she slipped and landed on her bottom on hard ground. Did not seek medical care. She denies any changes in fetal movement, no contractions, no bleeding or discharge.   A/P: G3P@ at 33.4 weeks, normal pregnancy. Advised if she has any falls/trauma to abdomen, should present for evaluation. DIscuss PTL precautions. May take tylenol for mild aches/pains. F/u in 2 weeks, will obtain cervical ctx, GBS at that visit.

## 2012-01-23 ENCOUNTER — Telehealth: Payer: Self-pay | Admitting: *Deleted

## 2012-01-23 NOTE — Telephone Encounter (Signed)
I received a phone call just now from Skeet Simmer (DOB: [birth year])that her mother has been refused permission from the [place] to come from [place] to [place] to help [place] when the baby comes.  Tkai was obviously crying and extremely upset, stating she will not come out of her home for any appts - including the Dr. - she will just have her baby at home.  She was asking me why this would happen.  All I could do was listen and tell her how sorry I am that her mother cannot come.  Her other two children were born in [place].  Her husband, Jenkins Rouge, is in full time school and work so is seldom home - however brings her for appts @ [facility].    Having done this job for going on 17 years I know it is a huge cultural thing for mom or aunt or other female family member to be here to help patient at time of delivery and for up to 6 months post partum.  I have called Oris Drone and left a message for him to call me. Thanks, Kandis Fantasia, RN, Collie Siad

## 2012-01-25 ENCOUNTER — Encounter: Payer: Self-pay | Admitting: Family Medicine

## 2012-01-25 ENCOUNTER — Telehealth: Payer: Self-pay | Admitting: Family Medicine

## 2012-01-25 ENCOUNTER — Other Ambulatory Visit (HOSPITAL_COMMUNITY)
Admission: RE | Admit: 2012-01-25 | Discharge: 2012-01-25 | Disposition: A | Discharge status: Home / Self Care | Payer: Medicaid Other | Source: Ambulatory Visit | Attending: Family Medicine | Admitting: Family Medicine

## 2012-01-25 ENCOUNTER — Ambulatory Visit (INDEPENDENT_AMBULATORY_CARE_PROVIDER_SITE_OTHER): Payer: Medicaid Other | Admitting: Family Medicine

## 2012-01-25 VITALS — BP 111/77 | Temp 98.3°F | Wt 183.1 lb

## 2012-01-25 DIAGNOSIS — Z3490 Encounter for supervision of normal pregnancy, unspecified, unspecified trimester: Secondary | ICD-10-CM

## 2012-01-25 DIAGNOSIS — Z3480 Encounter for supervision of other normal pregnancy, unspecified trimester: Secondary | ICD-10-CM

## 2012-01-25 DIAGNOSIS — Z23 Encounter for immunization: Secondary | ICD-10-CM

## 2012-01-25 DIAGNOSIS — Z113 Encounter for screening for infections with a predominantly sexual mode of transmission: Secondary | ICD-10-CM | POA: Insufficient documentation

## 2012-01-25 MED ORDER — FERROUS SULFATE 325 (65 FE) MG PO TABS: 325.0000 mg | ORAL_TABLET | Freq: Three times a day (TID) | ORAL | Status: DC

## 2012-01-25 NOTE — Telephone Encounter (Signed)
Miranda Downs says that her mother has been refused permission from the [place] to come from [place] to [place] to help [place] when the baby comes. Miranda Downs was obviously crying and extremely upset, stating she will not come out of her home for any appts - including the Dr. - she will just have her baby at home. Wants to know if [name] would possibly write a note/letter to [place] stating the medical reason why Miranda Downs will need her mother after the baby is born. Pls contact pt with questions.

## 2012-01-25 NOTE — Telephone Encounter (Signed)
Patient is in office today speaking with Dr. Swaziland about this

## 2012-01-25 NOTE — Patient Instructions (Signed)
Please take the iron 2-3 times a day.  It will help you with the anemia (the low red blood cells).  You should also drink the powder for the constipation to keep your stool soft. If the baby is not moving well (at least 10 times in 2 hours), if your water breaks, if you have bleeding, or if you have more than 4 contractions in one hour, you should go to [facility] hospital. We will write a letter to the [employer].  I hope it helps!

## 2012-01-25 NOTE — Progress Notes (Signed)
31 yo G3P2 here for routine OB.   C/o pelvic pain, especially when arising from sitting and when walking.  See flow sheet for details.  Denies LOF. Using hemorrhoid cream.  She has been feeling stressed lately because her husband is gone from 7 am until midnight (he studies engineering, accounting and works at [employer]) and she is home with her 2 sons alone (age 20 and 4).  After the baby comes, she will be alone with the 3 children.  She would like her mother to come from the [place] to help, but her visa was denied. PE: see flow sheet. Nl external female genitalia.  Nl vagina with small amount of white discharge.  Cervix friable, but no lesions.  GBS/GC/CZ done.  External hemorroids noted. A/P: Pregnancy - doing well.  PTL precautions and kick counts reviewed. Ligament laxity pain. Tdap today.GBS/GC/CZ today. Poor social support - will write another letter for the embassy to try to get her mother's visa approved.   Anemia - start iron 325 BID.  Continue with miralax. F/u 2 weeks with Dr. [name].

## 2012-01-26 ENCOUNTER — Telehealth: Payer: Self-pay | Admitting: Family Medicine

## 2012-01-26 NOTE — Telephone Encounter (Signed)
Patient is calling with questions from a letter she received.

## 2012-01-26 NOTE — Telephone Encounter (Signed)
Called pt and left message to call back.  (letter from Dr.Jordan 01-25-12 in chart) .Miranda Downs

## 2012-01-28 LAB — CULTURE, BETA STREP (GROUP B ONLY)

## 2012-01-29 NOTE — Telephone Encounter (Signed)
Spoke with patient and informed her that letter is up front to be picked up

## 2012-02-08 ENCOUNTER — Encounter: Payer: Medicaid Other | Admitting: Family Medicine

## 2012-02-14 ENCOUNTER — Ambulatory Visit (INDEPENDENT_AMBULATORY_CARE_PROVIDER_SITE_OTHER): Payer: Medicaid Other | Admitting: Family Medicine

## 2012-02-14 DIAGNOSIS — Z3480 Encounter for supervision of other normal pregnancy, unspecified trimester: Secondary | ICD-10-CM

## 2012-02-14 NOTE — Patient Instructions (Addendum)
You have gained good amount of weight. If you have contractions regularly every 5 minutes, go to women[facility] hospital. Make an appointment in one week. Make sure to take iron supplement three times daily.  Normal Labor and Delivery Your caregiver must first be sure you are in labor. Signs of labor include:  You may pass what is called "the mucus plug" before labor begins. This is a small amount of blood stained mucus.   Regular uterine contractions.   The time between contractions get closer together.   The discomfort and pain gradually gets more intense.   Pains are mostly located in the back.   Pains get worse when walking.   The cervix (the opening of the uterus becomes thinner (begins to efface) and opens up (dilates).  Once you are in labor and admitted into the hospital or care center, your caregiver will do the following:  A complete physical examination.   Check your vital signs (blood pressure, pulse, temperature and the fetal heart rate).   Do a vaginal examination (using a sterile glove and lubricant) to determine:   The position (presentation) of the baby (head [vertex] or buttock first).   The level (station) of the baby's head in the birth canal.   The effacement and dilatation of the cervix.   You may have your pubic hair shaved and be given an enema depending on your caregiver and the circumstance.   An electronic monitor is usually placed on your abdomen. The monitor follows the length and intensity of the contractions, as well as the baby's heart rate.   Usually, your caregiver will insert an IV in your arm with a bottle of sugar water. This is done as a precaution so that medications can be given to you quickly during labor or delivery.  NORMAL LABOR AND DELIVERY IS DIVIDED UP INTO 3 STAGES: First Stage This is when regular contractions begin and the cervix begins to efface and dilate. This stage can last from 3 to 15 hours. The end of the first stage is when  the cervix is 100% effaced and 10 centimeters dilated. Pain medications may be given by   Injection (morphine, demerol, etc.)   Regional anesthesia (spinal, caudal or epidural, anesthetics given in different locations of the spine). Paracervical pain medication may be given, which is an injection of and anesthetic on each side of the cervix.  A pregnant woman may request to have "Natural Childbirth" which is not to have any medications or anesthesia during her labor and delivery. Second Stage This is when the baby comes down through the birth canal (vagina) and is born. This can take 1 to 4 hours. As the baby's head comes down through the birth canal, you may feel like you are going to have a bowel movement. You will get the urge to bear down and push until the baby is delivered. As the baby's head is being delivered, the caregiver will decide if an episiotomy (a cut in the perineum and vagina area) is needed to prevent tearing of the tissue in this area. The episiotomy is sewn up after the delivery of the baby and placenta. Sometimes a mask with nitrous oxide is given for the mother to breath during the delivery of the baby to help if there is too much pain. The end of Stage 2 is when the baby is fully delivered. Then when the umbilical cord stops pulsating it is clamped and cut. Third Stage The third stage begins after the baby  is completely delivered and ends after the placenta (afterbirth) is delivered. This usually takes 5 to 30 minutes. After the placenta is delivered, a medication is given either by intravenous or injection to help contract the uterus and prevent bleeding. The third stage is not painful and pain medication is usually not necessary. If an episiotomy was done, it is repaired at this time. After the delivery, the mother is watched and monitored closely for 1 to 2 hours to make sure there is no postpartum bleeding (hemorrhage). If there is a lot of bleeding, medication is given to  contract the uterus and stop the bleeding. Document Released: 09/05/2008 Document Revised: 11/16/2011 Document Reviewed: 09/05/2008 [facility] Patient Information 2012 [place], [place].

## 2012-02-15 NOTE — Progress Notes (Signed)
38.2 ROB visit. Patient upset because mother still not granted a visa. Has not heard from embassy this week, but planning to call today. Complains of poor appetite, but up four lbs since last visit. Had one contraction day before yesterday, no pain, LOF, bleeding. Adequate FM. Taking iron when she remembers. Plans to discuss [facility] options with husband and BF.   A/P: G3P2 at 38.2 weeks, uncomplicated pregnancy. No signs of labor. Iron for anemia. Negative cultures and GBS reviewed. F/u in one week. Reviewed labor precautions.

## 2012-02-18 ENCOUNTER — Encounter (HOSPITAL_COMMUNITY): Payer: Self-pay

## 2012-02-18 ENCOUNTER — Inpatient Hospital Stay (HOSPITAL_COMMUNITY)
Admission: AD | Admit: 2012-02-18 | Discharge: 2012-02-20 | DRG: 775 | Disposition: A | Discharge status: Home / Self Care | Payer: Medicaid Other | Source: Ambulatory Visit | Attending: Obstetrics and Gynecology | Admitting: Obstetrics and Gynecology

## 2012-02-18 DIAGNOSIS — O2240 Hemorrhoids in pregnancy, unspecified trimester: Secondary | ICD-10-CM

## 2012-02-18 DIAGNOSIS — D509 Iron deficiency anemia, unspecified: Secondary | ICD-10-CM

## 2012-02-18 DIAGNOSIS — IMO0001 Reserved for inherently not codable concepts without codable children: Secondary | ICD-10-CM

## 2012-02-18 DIAGNOSIS — N760 Acute vaginitis: Secondary | ICD-10-CM

## 2012-02-18 DIAGNOSIS — Z3490 Encounter for supervision of normal pregnancy, unspecified, unspecified trimester: Secondary | ICD-10-CM

## 2012-02-18 MED ORDER — SIMETHICONE 80 MG PO CHEW: 80.0000 mg | CHEWABLE_TABLET | ORAL | Status: DC | PRN

## 2012-02-18 MED ORDER — WITCH HAZEL-GLYCERIN EX PADS: 1.0000 "application " | MEDICATED_PAD | CUTANEOUS | Status: DC | PRN

## 2012-02-18 MED ORDER — DIPHENHYDRAMINE HCL 25 MG PO CAPS: 25.0000 mg | ORAL_CAPSULE | Freq: Four times a day (QID) | ORAL | Status: DC | PRN

## 2012-02-18 MED ORDER — PRENATAL PLUS 27-1 MG PO TABS
1.0000 | ORAL_TABLET | Freq: Every day | ORAL | Status: DC
  Administered 2012-02-18 – 2012-02-20 (×3): 1 via ORAL
  Filled 2012-02-18 (×3): qty 1

## 2012-02-18 MED ORDER — DIBUCAINE 1 % RE OINT: 1.0000 "application " | TOPICAL_OINTMENT | RECTAL | Status: DC | PRN

## 2012-02-18 MED ORDER — TETANUS-DIPHTH-ACELL PERTUSSIS 5-2.5-18.5 LF-MCG/0.5 IM SUSP: 0.5000 mL | Freq: Once | INTRAMUSCULAR | Status: DC

## 2012-02-18 MED ORDER — IBUPROFEN 600 MG PO TABS
600.0000 mg | ORAL_TABLET | Freq: Four times a day (QID) | ORAL | Status: DC | PRN
  Administered 2012-02-18: 600 mg via ORAL
  Filled 2012-02-18: qty 1

## 2012-02-18 MED ORDER — ONDANSETRON HCL 4 MG/2ML IJ SOLN: 4.0000 mg | INTRAMUSCULAR | Status: DC | PRN

## 2012-02-18 MED ORDER — OXYTOCIN 10 UNIT/ML IJ SOLN
INTRAMUSCULAR | Status: AC
  Filled 2012-02-18: qty 1

## 2012-02-18 MED ORDER — PRENATAL MULTIVITAMIN CH: 1.0000 | ORAL_TABLET | Freq: Every day | ORAL | Status: DC

## 2012-02-18 MED ORDER — OXYCODONE-ACETAMINOPHEN 5-325 MG PO TABS
1.0000 | ORAL_TABLET | ORAL | Status: DC | PRN
  Administered 2012-02-18 (×2): 1 via ORAL
  Filled 2012-02-18 (×2): qty 1

## 2012-02-18 MED ORDER — NALBUPHINE SYRINGE 5 MG/0.5 ML: 5.0000 mg | INJECTION | INTRAMUSCULAR | Status: DC | PRN

## 2012-02-18 MED ORDER — OXYTOCIN BOLUS FROM INFUSION
500.0000 mL | Freq: Once | INTRAVENOUS | Status: DC
  Filled 2012-02-18: qty 500

## 2012-02-18 MED ORDER — IBUPROFEN 600 MG PO TABS
600.0000 mg | ORAL_TABLET | Freq: Four times a day (QID) | ORAL | Status: DC
  Administered 2012-02-18 – 2012-02-20 (×7): 600 mg via ORAL
  Filled 2012-02-18 (×5): qty 1
  Filled 2012-02-18: qty 2

## 2012-02-18 MED ORDER — SENNOSIDES-DOCUSATE SODIUM 8.6-50 MG PO TABS
2.0000 | ORAL_TABLET | Freq: Every day | ORAL | Status: DC
  Administered 2012-02-18 – 2012-02-19 (×2): 2 via ORAL

## 2012-02-18 MED ORDER — ACETAMINOPHEN 325 MG PO TABS: 650.0000 mg | ORAL_TABLET | ORAL | Status: DC | PRN

## 2012-02-18 MED ORDER — CITRIC ACID-SODIUM CITRATE 334-500 MG/5ML PO SOLN: 30.0000 mL | ORAL | Status: DC | PRN

## 2012-02-18 MED ORDER — OXYCODONE-ACETAMINOPHEN 5-325 MG PO TABS
1.0000 | ORAL_TABLET | ORAL | Status: DC | PRN
  Administered 2012-02-19: 1 via ORAL
  Filled 2012-02-18: qty 1

## 2012-02-18 MED ORDER — ONDANSETRON HCL 4 MG/2ML IJ SOLN: 4.0000 mg | Freq: Four times a day (QID) | INTRAMUSCULAR | Status: DC | PRN

## 2012-02-18 MED ORDER — LIDOCAINE HCL (PF) 1 % IJ SOLN: 30.0000 mL | INTRAMUSCULAR | Status: DC | PRN

## 2012-02-18 MED ORDER — BENZOCAINE-MENTHOL 20-0.5 % EX AERO: 1.0000 "application " | INHALATION_SPRAY | CUTANEOUS | Status: DC | PRN

## 2012-02-18 MED ORDER — ZOLPIDEM TARTRATE 5 MG PO TABS: 5.0000 mg | ORAL_TABLET | Freq: Every evening | ORAL | Status: DC | PRN

## 2012-02-18 MED ORDER — LACTATED RINGERS IV SOLN: INTRAVENOUS | Status: DC

## 2012-02-18 MED ORDER — FERROUS SULFATE 325 (65 FE) MG PO TABS
325.0000 mg | ORAL_TABLET | Freq: Three times a day (TID) | ORAL | Status: DC
  Administered 2012-02-19 – 2012-02-20 (×5): 325 mg via ORAL
  Filled 2012-02-18 (×5): qty 1

## 2012-02-18 MED ORDER — ONDANSETRON HCL 4 MG PO TABS: 4.0000 mg | ORAL_TABLET | ORAL | Status: DC | PRN

## 2012-02-18 MED ORDER — LANOLIN HYDROUS EX OINT: TOPICAL_OINTMENT | CUTANEOUS | Status: DC | PRN

## 2012-02-18 MED ORDER — LACTATED RINGERS IV SOLN: 500.0000 mL | INTRAVENOUS | Status: DC | PRN

## 2012-02-18 MED ORDER — FLEET ENEMA 7-19 GM/118ML RE ENEM: 1.0000 | ENEMA | RECTAL | Status: DC | PRN

## 2012-02-18 MED ORDER — LIDOCAINE HCL (PF) 1 % IJ SOLN
INTRAMUSCULAR | Status: AC
  Filled 2012-02-18: qty 30

## 2012-02-18 MED ORDER — OXYTOCIN 20 UNITS IN LACTATED RINGERS INFUSION - SIMPLE: 125.0000 mL/h | Freq: Once | INTRAVENOUS | Status: DC

## 2012-02-18 NOTE — H&P (Signed)
Miranda Downs is a 31 y.o. female presenting for labor evaluation. Maternal Medical History:  Reason for admission: Reason for admission: contractions.  Reason for Admission:   nauseaContractions: Onset was 1-2 hours ago.   Frequency: regular.   Duration is approximately 1 minute.   Perceived severity is strong.    Fetal activity: Perceived fetal activity is normal.   Last perceived fetal movement was within the past hour.    Prenatal complications: no prenatal complications Prenatal Complications - Diabetes: none.    OB History    Grav Para Term Preterm Abortions TAB SAB Ect Mult Living   3 2 2  0 0 0 0 0 0 2     Past Medical History  Diagnosis Date  . No pertinent past medical history    Past Surgical History  Procedure Date  . No past surgeries    Family History: family history includes Diabetes in her father and Hypertension in her father. Social History:  reports that she has never smoked. She has never used smokeless tobacco. She reports that she does not drink alcohol or use illicit drugs.  Review of Systems  Constitutional: Negative for fever, chills and malaise/fatigue.  Eyes: Negative for blurred vision.  Respiratory: Negative for cough and shortness of breath.   Cardiovascular: Negative for chest pain.  Gastrointestinal: Negative for heartburn, nausea and vomiting.  Genitourinary: Negative for dysuria, urgency and frequency.  Musculoskeletal: Negative.   Neurological: Negative for dizziness and headaches.  Psychiatric/Behavioral: Negative for depression.    Dilation: 9 Effacement (%): 100 Station: -2 Exam by:: Elie Confer RN Last menstrual period 05/22/2011. Maternal Exam:  Uterine Assessment: Contraction strength is firm.  Contraction duration is 80 seconds. Contraction frequency is regular.   Abdomen: Patient reports no abdominal tenderness. Cervix: Cervix evaluated by digital exam.     Fetal Exam Fetal Monitor Review: Mode: ultrasound.   Baseline  rate: 140.  Variability: moderate (6-25 bpm).   Pattern: no decelerations and accelerations present.    Fetal State Assessment: Category I - tracings are normal.     Physical Exam  Nursing note and vitals reviewed. Constitutional: She is oriented to person, place, and time. She appears well-developed and well-nourished.  Neck: Normal range of motion.  Cardiovascular: Normal rate.   Respiratory: Effort normal.  GI: Soft.  Musculoskeletal: Normal range of motion.  Neurological: She is alert and oriented to person, place, and time. She has normal reflexes.  Skin: Skin is warm and dry.  Psychiatric: She has a normal mood and affect. Her behavior is normal. Judgment and thought content normal.    Prenatal labs: ABO, Rh: B/POS/-- (11/21 0932) Antibody: NEG (11/21 0932) Rubella: 38.3 (11/21 0932) RPR: NON REAC (01/04 1139)  HBsAg: NEGATIVE (11/21 0932)  HIV: NON REACTIVE (01/04 1139)  GBS:   Negative on 01/25/12 GTT: 1 hour 126  Assessment/Plan: Admit to birthing suites Anticipate NSVD   [facility]KIRBY, Fantasy Donald 02/18/2012, 5:37 PM

## 2012-02-18 NOTE — Progress Notes (Signed)
Pt care given to Stoney Bang, RN

## 2012-02-18 NOTE — H&P (Signed)
Attestation of Attending Supervision of Advanced Practitioner: Evaluation and management procedures were performed by the PA/NP/CNM/OB Fellow under my supervision/collaboration. Chart reviewed and agree with management and plan.  Tilda Burrow 02/18/2012 6:38 PM

## 2012-02-18 NOTE — Progress Notes (Signed)
Lost consciousness During delivery of shoulders.  Unresponsive for approx 1 min.  Regained consciousness, but remained disoriented for approx .  BP WNL.  Bleeding WNL.  O2 sat 100% on RA.  CNM present & aware.

## 2012-02-19 ENCOUNTER — Encounter: Payer: Medicaid Other | Admitting: Family Medicine

## 2012-02-19 LAB — RPR: RPR Ser Ql: NONREACTIVE

## 2012-02-19 NOTE — Progress Notes (Signed)
Post Partum Day 1 Subjective: no complaints, up ad lib and voiding Has not had food yet. No stool yet.  Some abdominal pain with breastfeeding.  Objective: Blood pressure 112/75, pulse 81, temperature 98 F (36.7 C), temperature source Oral, resp. rate 20, last menstrual period 05/22/2011, SpO2 100.00%, unknown if currently breastfeeding.  Physical Exam:  General: alert, cooperative and no distress Lochia: appropriate Uterine Fundus: firm Incision: n/a DVT Evaluation: No evidence of DVT seen on physical exam. Negative Homan's sign.  No results found for this basename: HGB:2,HCT:2 in the last 72 hours  Assessment/Plan: Plan for discharge tomorrow, Breastfeeding and Contraception likely Mirena, will discuss with husband.    LOS: 1 day   Ala Dach 02/19/2012, 7:28 AM

## 2012-02-19 NOTE — Progress Notes (Signed)
Sw referral received to assist with car seat purchase.  RN called volunteer services to handle pt's request. 

## 2012-02-19 NOTE — Progress Notes (Signed)
UR chart review completed.  

## 2012-02-20 MED ORDER — BENZOCAINE-MENTHOL 20-0.5 % EX AERO: 1.0000 "application " | INHALATION_SPRAY | CUTANEOUS | Status: DC | PRN

## 2012-02-20 MED ORDER — IBUPROFEN 600 MG PO TABS: 600.0000 mg | ORAL_TABLET | Freq: Four times a day (QID) | ORAL | Status: AC

## 2012-02-20 MED ORDER — SENNOSIDES-DOCUSATE SODIUM 8.6-50 MG PO TABS: 2.0000 | ORAL_TABLET | Freq: Every day | ORAL | Status: DC

## 2012-02-20 NOTE — Discharge Instructions (Signed)

## 2012-02-20 NOTE — Discharge Summary (Signed)
  Obstetric Discharge Summary Reason for Admission: onset of labor Prenatal Procedures: NST Intrapartum Procedures: spontaneous vaginal delivery Postpartum Procedures: none Complications-Operative and Postpartum: none Hemoglobin  Date Value Range Status  12/15/2011 9.8* 12.0-15.0 (g/dL) Final     HCT  Date Value Range Status  12/15/2011 31.5* 36.0-46.0 (%) Final    Discharge Diagnoses: Term Pregnancy-delivered  Discharge Information: Date: 02/20/2012 Activity: pelvic rest Diet: routine Medications: PNV, Ibuprofen, Colace and Iron Condition: stable Instructions: refer to practice specific booklet Discharge to: home Follow-up Information    Follow up with Lloyd Huger, MD. Schedule an appointment as soon as possible for a visit in 6 weeks.   Contact information:   [address] [place] [address] [phone]          Newborn Data: Live born female  Birth Weight: 7 lb 4 oz (3289 g) APGAR: 8, 9  Home with mother.  Zani Kyllonen 02/20/2012, 7:56 AM

## 2012-02-20 NOTE — Progress Notes (Signed)
Patient ID: Miranda Downs, female   DOB: 07-Dec-1981, 31 y.o.   MRN: [id number] Post Partum Day 2 Subjective: no complaints, up ad lib, voiding, tolerating PO and laughing and happy. Baby is slow to breastfeed, latch seems poor and supplementing formula  Objective: Blood pressure 120/74, pulse 93, temperature 98.4 F (36.9 C), temperature source Oral, resp. rate 18, last menstrual period 05/22/2011, SpO2 97.00%, unknown if currently breastfeeding.  Physical Exam:  General: alert, cooperative and appears stated age Lochia: appropriate Uterine Fundus: firm DVT Evaluation: No evidence of DVT seen on physical exam. No cords or calf tenderness.  No results found for this basename: HGB:2,HCT:2 in the last 72 hours  Assessment/Plan: Discharge home, Breastfeeding, Social Work consult and Contraception plans mirena. Continue iron TID for anemia.   LOS: 2 days   [facility] 02/20/2012, 7:54 AM

## 2012-02-21 NOTE — Discharge Summary (Signed)
Pt seen by me also, agree with note

## 2012-02-24 ENCOUNTER — Other Ambulatory Visit (HOSPITAL_COMMUNITY): Payer: Self-pay | Admitting: Family Medicine

## 2012-02-25 NOTE — Telephone Encounter (Signed)
Refill request

## 2012-04-08 ENCOUNTER — Encounter: Payer: Self-pay | Admitting: Family Medicine

## 2012-04-08 ENCOUNTER — Ambulatory Visit (INDEPENDENT_AMBULATORY_CARE_PROVIDER_SITE_OTHER): Payer: Medicaid Other | Admitting: Family Medicine

## 2012-04-08 VITALS — BP 98/75 | HR 79 | Temp 97.6°F | Wt 170.5 lb

## 2012-04-08 DIAGNOSIS — D649 Anemia, unspecified: Secondary | ICD-10-CM

## 2012-04-08 DIAGNOSIS — Z309 Encounter for contraceptive management, unspecified: Secondary | ICD-10-CM

## 2012-04-08 DIAGNOSIS — Z30430 Encounter for insertion of intrauterine contraceptive device: Secondary | ICD-10-CM

## 2012-04-08 LAB — POCT HEMOGLOBIN: Hemoglobin: 11.8 g/dL — AB (ref 12.2–16.2)

## 2012-04-08 LAB — POCT URINE PREGNANCY: Preg Test, Ur: NEGATIVE

## 2012-04-08 MED ORDER — LEVONORGESTREL 20 MCG/24HR IU IUD
INTRAUTERINE_SYSTEM | Freq: Once | INTRAUTERINE | Status: AC
  Administered 2012-04-08: 1 via INTRAUTERINE

## 2012-04-08 NOTE — Patient Instructions (Signed)
Mirena is good for birth control. You may have some mild bleeding and cramping.  Can take tylenol (acetaminophen) as needed. If you have pain, excess bleeding or any concerns then see a doctor. You should continue with prenatal vitamins, iron and yearly physical exam.   Intrauterine Device Insertion Most often, an intrauterine device (IUD) is inserted into the uterus to prevent pregnancy. There are 2 types of IUDs available:  Copper IUD. This type of IUD creates an environment that is not favorable to sperm survival. The mechanism of action of the copper IUD is not known for certain. It can stay in place for 10 years.   Hormone IUD. This type of IUD contains the hormone progestin (synthetic progesterone). The progestin thickens the cervical mucus and prevents sperm from entering the uterus, and it also thins the uterine lining. There is no evidence that the hormone IUD prevents implantation. The hormone IUD can stay in place for up to 5 years.  An IUD is the most cost-effective birth control if left in place for the full duration. It may be removed at any time. LET YOUR CAREGIVER KNOW ABOUT:  Sensitivity to metals.   Medicines taken including herbs, eyedrops, over-the-counter medicines, and creams.   Use of steroids (by mouth or creams).   Previous problems with anesthetics or numbing medicine.   Previous gynecological surgery.   History of blood clots or clotting disorders.   Possibility of pregnancy.   Menstrual irregularities.   Concerns regarding unusual vaginal discharge or odors.   Previous experience with an IUD.   Other health problems.  RISKS AND COMPLICATIONS  Accidental puncture (perforation) of the uterus.   Accidental placement of the IUD either in the muscle layer of the uterus (myometrium) or outside the uterus. If this happen, the IUD can be found essentially floating around the bowels. When this happens, the IUD must be taken out surgically.   The IUD may  fall out of the uterus (expulsion). This is more common in women who have recently had a child.    Pregnancy in the fallopian tube (ectopic).  BEFORE THE PROCEDURE  Schedule the IUD insertion for when you will have your menstrual period or right after, to make sure you are not pregnant. Placement of the IUD is better tolerated shortly after a menstrual cycle.   You may need to take tests or be examined to make sure you are not pregnant.   You may be required to take a pregnancy test.   You may be required to get checked for sexually transmitted infections (STIs) prior to placement. Placing an IUD in someone who has an infection can make an infection worse.   You may be given a pain reliever to take 1 or 2 hours before the procedure.   An exam will be performed to determine the size and position of your uterus.   Ask your caregiver about changing or stopping your regular medicines.  PROCEDURE   A tool (speculum) is placed in the vagina. This allows your caregiver to see the lower part of the uterus (cervix).   The cervix is prepped with a medicine that lowers the risk of infection.   You may be given a medicine to numb each side of the cervix (intracervical or paracervical block). This is used to block and control any discomfort with insertion.   A tool (uterine sound) is inserted into the uterus to determine the length of the uterine cavity and the direction the uterus may be tilted.  A slim instrument (IUD inserter) is inserted through the cervical canal and into your uterus.   The IUD is placed in the uterine cavity and the insertion device is removed.   The nylon string that is attached to the IUD, and used for eventual IUD removal, is trimmed. It is trimmed so that it lays high in the vagina, just outside the cervix.  AFTER THE PROCEDURE  You may have bleeding after the procedure. This is normal. It varies from light spotting for a few days to menstrual-like bleeding.    You may have mild cramping.   Practice checking the string coming out of the cervix to make sure the IUD remains in the uterus. If you cannot feel the string, you should schedule a "string check" with your caregiver.   If you had a hormone IUD inserted, expect that your period may be lighter or nonexistent within a year's time (though this is not always the case). There may be delayed fertility with the hormone IUD as a result of its progesterone effect. When you are ready to become pregnant, it is suggested to have the IUD removed up to 1 year in advance.   Yearly exams are advised.  Document Released: 07/26/2011 Document Revised: 11/16/2011 Document Reviewed: 07/26/2011 [facility] Patient Information 2012 [place], [place].

## 2012-04-08 NOTE — Progress Notes (Signed)
  Subjective:     Miranda Downs is a 31 y.o. female who presents for a postpartum visit. She is 6 weeks postpartum following a spontaneous vaginal delivery. I have fully reviewed the prenatal and intrapartum course. The delivery was at term gestation. Outcome: spontaneous vaginal delivery. Anesthesia: none. Postpartum course has been unccomplicated. On iron for anemia and having some intermittent back pains. Baby's course has been normal. Baby is feeding by breast. Bleeding no bleeding. Bowel function is normal. Bladder function is normal. Patient is not sexually active. Contraception method is none and desires mirena. Postpartum depression screening: negative but patient has 2 small children at home, father works long hours. She again requests letter be written on her behalf to the embassy so they would consider allowing her mother to visit and help with childcare/support.  The following portions of the patient's history were reviewed and updated as appropriate: allergies, current medications, past family history, past medical history, past social history, past surgical history and problem list.  Review of Systems Pertinent items are noted in HPI.   Objective:    BP 98/75  Pulse 79  Temp(Src) 97.6 F (36.4 C) (Oral)  Wt 170 lb 8 oz (77.338 kg)  General:  alert, cooperative and no distress   Breasts:  inspection negative, no nipple discharge or bleeding, no masses or nodularity palpable  Lungs: clear to auscultation bilaterally  Heart:  regular rate and rhythm, S1, S2 normal, no murmur, click, rub or gallop  Abdomen: soft, non-tender; bowel sounds normal; no masses,  no organomegaly   Vulva:  normal  Vagina: normal vagina  Cervix:  anteverted  Corpus: normal size, contour, position, consistency, mobility, non-tender  Adnexa:  no mass, fullness, tenderness  Rectal Exam: external hemorrhoids and no tenderness or erythema        Assessment:    Normal postpartum exam. Pap smear not done at  today's visit (normal in 2011).   IUD Insertion Procedure Note  Pre-operative Diagnosis: desires contraception  Post-operative Diagnosis: normal  Indications: contraception  Procedure Details  Urine pregnancy test was done and result was negative.  The risks (including infection, bleeding, pain, and uterine perforation) and benefits of the procedure were explained to the patient and Written informed consent was obtained.    Cervix cleansed with Betadine. Uterus sounded to 5 cm. IUD inserted without difficulty. String visible and trimmed. Patient tolerated procedure well.  IUD Information: Mirena.  Condition: Stable  Complications: None Case d/w Dr. [place].   Plan:    1. Contraception: IUD and breastfeeding. The patient was advised to call for any fever or for prolonged or severe pain or bleeding. She was advised to use OTC analgesics as needed for mild to moderate pain.   2. Hemoglobin today-f/u anemia during pregnancy  3. Follow up in: 1 year or as needed.

## 2012-05-03 ENCOUNTER — Encounter: Payer: Self-pay | Admitting: Family Medicine

## 2012-05-03 ENCOUNTER — Ambulatory Visit (INDEPENDENT_AMBULATORY_CARE_PROVIDER_SITE_OTHER): Payer: Medicaid Other | Admitting: Family Medicine

## 2012-05-03 VITALS — BP 98/71 | HR 82 | Temp 98.6°F | Ht 62.5 in | Wt 173.0 lb

## 2012-05-03 DIAGNOSIS — M62830 Muscle spasm of back: Secondary | ICD-10-CM

## 2012-05-03 DIAGNOSIS — M539 Dorsopathy, unspecified: Secondary | ICD-10-CM

## 2012-05-03 NOTE — Patient Instructions (Signed)
Back Exercises These exercises may help you when beginning to rehabilitate your injury. Your symptoms may resolve with or without further involvement from your physician, physical therapist or athletic trainer. While completing these exercises, remember:   Restoring tissue flexibility helps normal motion to return to the joints. This allows healthier, less painful movement and activity.   An effective stretch should be held for at least 30 seconds.   A stretch should never be painful. You should only feel a gentle lengthening or release in the stretched tissue.  STRETCH - Extension, Prone on Elbows   Lie on your stomach on the floor, a bed will be too soft. Place your palms about shoulder width apart and at the height of your head.   Place your elbows under your shoulders. If this is too painful, stack pillows under your chest.   Allow your body to relax so that your hips drop lower and make contact more completely with the floor.   Hold this position for __________ seconds.   Slowly return to lying flat on the floor.  Repeat __________ times. Complete this exercise __________ times per day.  RANGE OF MOTION - Extension, Prone Press Ups   Lie on your stomach on the floor, a bed will be too soft. Place your palms about shoulder width apart and at the height of your head.   Keeping your back as relaxed as possible, slowly straighten your elbows while keeping your hips on the floor. You may adjust the placement of your hands to maximize your comfort. As you gain motion, your hands will come more underneath your shoulders.   Hold this position __________ seconds.   Slowly return to lying flat on the floor.  Repeat __________ times. Complete this exercise __________ times per day.  RANGE OF MOTION- Quadruped, Neutral Spine   Assume a hands and knees position on a firm surface. Keep your hands under your shoulders and your knees under your hips. You may place padding under your knees for  comfort.   Drop your head and point your tail bone toward the ground below you. This will round out your low back like an angry cat. Hold this position for __________ seconds.   Slowly lift your head and release your tail bone so that your back sags into a large arch, like an old horse.   Hold this position for __________ seconds.   Repeat this until you feel limber in your low back.   Now, find your "sweet spot." This will be the most comfortable position somewhere between the two previous positions. This is your neutral spine. Once you have found this position, tense your stomach muscles to support your low back.   Hold this position for __________ seconds.  Repeat __________ times. Complete this exercise __________ times per day.  STRETCH - Flexion, Single Knee to Chest   Lie on a firm bed or floor with both legs extended in front of you.   Keeping one leg in contact with the floor, bring your opposite knee to your chest. Hold your leg in place by either grabbing behind your thigh or at your knee.   Pull until you feel a gentle stretch in your low back. Hold __________ seconds.   Slowly release your grasp and repeat the exercise with the opposite side.  Repeat __________ times. Complete this exercise __________ times per day.  STRETCH - Hamstrings, Standing  Stand or sit and extend your right / left leg, placing your foot on a chair   or foot stool   Keeping a slight arch in your low back and your hips straight forward.   Lead with your chest and lean forward at the waist until you feel a gentle stretch in the back of your right / left knee or thigh. (When done correctly, this exercise requires leaning only a small distance.)   Hold this position for __________ seconds.  Repeat __________ times. Complete this stretch __________ times per day. STRENGTHENING - Deep Abdominals, Pelvic Tilt   Lie on a firm bed or floor. Keeping your legs in front of you, bend your knees so they are  both pointed toward the ceiling and your feet are flat on the floor.   Tense your lower abdominal muscles to press your low back into the floor. This motion will rotate your pelvis so that your tail bone is scooping upwards rather than pointing at your feet or into the floor.   With a gentle tension and even breathing, hold this position for __________ seconds.  Repeat __________ times. Complete this exercise __________ times per day.  STRENGTHENING - Abdominals, Crunches   Lie on a firm bed or floor. Keeping your legs in front of you, bend your knees so they are both pointed toward the ceiling and your feet are flat on the floor. Cross your arms over your chest.   Slightly tip your chin down without bending your neck.   Tense your abdominals and slowly lift your trunk high enough to just clear your shoulder blades. Lifting higher can put excessive stress on the low back and does not further strengthen your abdominal muscles.   Control your return to the starting position.  Repeat __________ times. Complete this exercise __________ times per day.  STRENGTHENING - Quadruped, Opposite UE/LE Lift   Assume a hands and knees position on a firm surface. Keep your hands under your shoulders and your knees under your hips. You may place padding under your knees for comfort.   Find your neutral spine and gently tense your abdominal muscles so that you can maintain this position. Your shoulders and hips should form a rectangle that is parallel with the floor and is not twisted.   Keeping your trunk steady, lift your right hand no higher than your shoulder and then your left leg no higher than your hip. Make sure you are not holding your breath. Hold this position __________ seconds.   Continuing to keep your abdominal muscles tense and your back steady, slowly return to your starting position. Repeat with the opposite arm and leg.  Repeat __________ times. Complete this exercise __________ times per  day. Document Released: 12/15/2005 Document Revised: 11/16/2011 Document Reviewed: 03/11/2009 [employer] Patient Information 2012 [employer]. 

## 2012-05-03 NOTE — Progress Notes (Signed)
  Subjective:    Patient ID: Miranda Downs, female    DOB: 1981/01/16, 31 y.o.   MRN: [id number]  HPI  Patient presents today 10 weeks after vaginal delivery. Since her delivery, she has had left-sided back pain. This pain is described as pressure. It does not radiate. There was no injury. She has not had fevers. She's had no changes in her urination or bowel movements. This pain is worse when she is standing for long periods or when she leans forward. It also hurts when she sits. She has a hard time getting comfortable in bed.  Review of Systems    see above Objective:   Physical Exam  Vital signs reviewed General appearance - alert, well appearing, and in no distress MSK-point tenderness over the soft tissue at L2-L3. Full range of motion of the back. No weakness of the legs. No tenderness over the bony prominences      Assessment & Plan:

## 2012-05-03 NOTE — Assessment & Plan Note (Signed)
Likely psoas muscle spasm. No radicular symptoms or red flags. Manipulation performed by Dr. Katrinka Blazing today. Gave instructions for back exercises. 2 return in 6 weeks if no better. To use Tylenol for pain intermittently.

## 2012-06-24 ENCOUNTER — Other Ambulatory Visit: Payer: Self-pay | Admitting: Family Medicine

## 2012-06-24 DIAGNOSIS — M62830 Muscle spasm of back: Secondary | ICD-10-CM

## 2012-07-12 ENCOUNTER — Ambulatory Visit: Payer: Medicaid Other | Attending: Family Medicine | Admitting: Physical Therapy

## 2013-01-24 ENCOUNTER — Emergency Department (HOSPITAL_COMMUNITY)
Admission: EM | Admit: 2013-01-24 | Discharge: 2013-01-25 | Disposition: A | Discharge status: Home / Self Care | Payer: Medicaid Other | Attending: Emergency Medicine | Admitting: Emergency Medicine

## 2013-01-24 ENCOUNTER — Encounter (HOSPITAL_COMMUNITY): Payer: Self-pay | Admitting: Emergency Medicine

## 2013-01-24 ENCOUNTER — Emergency Department (HOSPITAL_COMMUNITY): Payer: Medicaid Other

## 2013-01-24 DIAGNOSIS — R51 Headache: Secondary | ICD-10-CM | POA: Insufficient documentation

## 2013-01-24 DIAGNOSIS — R059 Cough, unspecified: Secondary | ICD-10-CM | POA: Insufficient documentation

## 2013-01-24 DIAGNOSIS — J40 Bronchitis, not specified as acute or chronic: Secondary | ICD-10-CM | POA: Insufficient documentation

## 2013-01-24 DIAGNOSIS — N61 Mastitis without abscess: Secondary | ICD-10-CM | POA: Insufficient documentation

## 2013-01-24 DIAGNOSIS — Z3202 Encounter for pregnancy test, result negative: Secondary | ICD-10-CM | POA: Insufficient documentation

## 2013-01-24 DIAGNOSIS — R05 Cough: Secondary | ICD-10-CM | POA: Insufficient documentation

## 2013-01-24 DIAGNOSIS — R1110 Vomiting, unspecified: Secondary | ICD-10-CM | POA: Insufficient documentation

## 2013-01-24 LAB — COMPREHENSIVE METABOLIC PANEL
ALT: 13 U/L (ref 0–35)
AST: 27 U/L (ref 0–37)
Alkaline Phosphatase: 78 U/L (ref 39–117)
CO2: 26 mEq/L (ref 19–32)
Calcium: 10.7 mg/dL — ABNORMAL HIGH (ref 8.4–10.5)
Chloride: 99 mEq/L (ref 96–112)
GFR calc non Af Amer: >90 mL/min (ref >90)
Glucose, Bld: 111 mg/dL — ABNORMAL HIGH (ref 70–99)
Potassium: 4.5 mEq/L (ref 3.5–5.1)
Sodium: 138 mEq/L (ref 135–145)
Total Bilirubin: 0.2 mg/dL — ABNORMAL LOW (ref 0.3–1.2)

## 2013-01-24 LAB — CBC WITH DIFFERENTIAL/PLATELET
Basophils Absolute: 0 10*3/uL (ref 0.0–0.1)
Basophils Relative: 0 % (ref 0–1)
Eosinophils Absolute: 0 10*3/uL (ref 0.0–0.7)
Hemoglobin: 12.7 g/dL (ref 12.0–15.0)
Lymphocytes Relative: 7 % — ABNORMAL LOW (ref 12–46)
MCHC: 32.6 g/dL (ref 30.0–36.0)
Neutrophils Relative %: 89 % — ABNORMAL HIGH (ref 43–77)

## 2013-01-24 MED ORDER — ACETAMINOPHEN 325 MG PO TABS
650.0000 mg | ORAL_TABLET | Freq: Once | ORAL | Status: AC
  Administered 2013-01-24: 650 mg via ORAL
  Filled 2013-01-24: qty 2

## 2013-01-24 MED ORDER — VANCOMYCIN HCL IN DEXTROSE 1-5 GM/200ML-% IV SOLN
1000.0000 mg | Freq: Once | INTRAVENOUS | Status: AC
  Administered 2013-01-25: 1000 mg via INTRAVENOUS
  Filled 2013-01-24: qty 200

## 2013-01-24 NOTE — ED Notes (Signed)
Pt c/o pain on entire right side, headache and bil breast swelling.  Also c/o elevated fever onset approx 4pm today.

## 2013-01-24 NOTE — ED Notes (Signed)
Not sure what time pt took Tylenol at home.

## 2013-01-25 LAB — URINALYSIS, ROUTINE W REFLEX MICROSCOPIC
Bilirubin Urine: NEGATIVE
Ketones, ur: NEGATIVE mg/dL
Specific Gravity, Urine: 1.023 (ref 1.005–1.030)
Urobilinogen, UA: 0.2 mg/dL (ref 0.0–1.0)

## 2013-01-25 LAB — URINE MICROSCOPIC-ADD ON

## 2013-01-25 MED ORDER — LANOLIN EX OINT: TOPICAL_OINTMENT | CUTANEOUS | Status: DC | PRN

## 2013-01-25 MED ORDER — HYDROCODONE-ACETAMINOPHEN 5-325 MG PO TABS: 1.0000 | ORAL_TABLET | Freq: Four times a day (QID) | ORAL | Status: DC | PRN

## 2013-01-25 MED ORDER — SODIUM CHLORIDE 0.9 % IV SOLN
Freq: Once | INTRAVENOUS | Status: AC
  Administered 2013-01-25: 01:00:00 via INTRAVENOUS

## 2013-01-25 MED ORDER — SULFAMETHOXAZOLE-TRIMETHOPRIM 800-160 MG PO TABS: 1.0000 | ORAL_TABLET | Freq: Two times a day (BID) | ORAL | Status: DC

## 2013-01-25 NOTE — ED Notes (Signed)
Pt given resources for breast pump. Pt to go to [facility] when discharged to pick pump up.

## 2013-01-25 NOTE — ED Provider Notes (Signed)
History     CSN: [id number]  Arrival date & time 01/24/13  1918   First MD Initiated Contact with Patient 01/24/13 2329      Chief Complaint  Patient presents with  . Pain    (Consider location/radiation/quality/duration/timing/severity/associated sxs/prior treatment) Patient is a 32 y.o. female presenting with fever. The history is provided by the patient (the pt complains of fever and aches with breast pain and cough).  Fever Temp source:  Subjective Severity:  Moderate Onset quality:  Sudden Timing:  Intermittent Chronicity:  New Worsened by:  Nothing tried Ineffective treatments:  Acetaminophen Associated symptoms: cough   Associated symptoms: no chest pain, no congestion, no diarrhea, no headaches and no rash     Past Medical History  Diagnosis Date  . No pertinent past medical history     Past Surgical History  Procedure Laterality Date  . No past surgeries      Family History  Problem Relation Age of Onset  . Hypertension Father   . Diabetes Father     History  Substance Use Topics  . Smoking status: Never Smoker   . Smokeless tobacco: Never Used  . Alcohol Use: No    OB History   Grav Para Term Preterm Abortions TAB SAB Ect Mult Living   3 3 3  0 0 0 0 0 0 3      Review of Systems  Constitutional: Positive for fever. Negative for fatigue.  HENT: Negative for congestion, sinus pressure and ear discharge.   Eyes: Negative for discharge.  Respiratory: Positive for cough.   Cardiovascular: Negative for chest pain.  Gastrointestinal: Negative for abdominal pain and diarrhea.  Genitourinary: Negative for frequency and hematuria.  Musculoskeletal: Negative for back pain.  Skin: Negative for rash.  Neurological: Negative for seizures and headaches.  Psychiatric/Behavioral: Negative for hallucinations.    Allergies  Review of patient's allergies indicates no known allergies.  Home Medications   Current Outpatient Rx  Name  Route  Sig   Dispense  Refill  . acetaminophen (TYLENOL) 325 MG tablet   Oral   Take 650 mg by mouth every 6 (six) hours as needed for pain.         Marland Kitchen HYDROcodone-acetaminophen (NORCO/VICODIN) 5-325 MG per tablet   Oral   Take 1 tablet by mouth every 6 (six) hours as needed for pain (prn pain not helped by motrin).   15 tablet   0   . sulfamethoxazole-trimethoprim (SEPTRA DS) 800-160 MG per tablet   Oral   Take 1 tablet by mouth every 12 (twelve) hours.   20 tablet   0     BP 96/62  Pulse 83  Temp(Src) 98.4 F (36.9 C) (Oral)  Resp 20  SpO2 98%  LMP 01/16/2013  Breastfeeding? No  Physical Exam  Constitutional: She is oriented to person, place, and time. She appears well-developed.  HENT:  Head: Normocephalic and atraumatic.  Eyes: Conjunctivae and EOM are normal. No scleral icterus.  Neck: Neck supple. No thyromegaly present.  Cardiovascular: Normal rate and regular rhythm.  Exam reveals no gallop and no friction rub.   No murmur heard. Pulmonary/Chest: No stridor. She has no wheezes. She has no rales. She exhibits no tenderness.  Abdominal: She exhibits no distension. There is no tenderness. There is no rebound.  Musculoskeletal: Normal range of motion. She exhibits no edema.  Lymphadenopathy:    She has no cervical adenopathy.  Neurological: She is oriented to person, place, and time. Coordination normal.  Skin:  No rash noted. No erythema.  Psychiatric: She has a normal mood and affect. Her behavior is normal.    ED Course  Procedures (including critical care time)  Labs Reviewed  CBC WITH DIFFERENTIAL - Abnormal; Notable for the following:    WBC 11.8 (*)    RBC 5.64 (*)    MCV 69.0 (*)    MCH 22.5 (*)    Neutrophils Relative 89 (*)    Lymphocytes Relative 7 (*)    Neutro Abs 10.5 (*)    All other components within normal limits  COMPREHENSIVE METABOLIC PANEL - Abnormal; Notable for the following:    Glucose, Bld 111 (*)    Calcium 10.7 (*)    Total Protein 8.5  (*)    Total Bilirubin 0.2 (*)    All other components within normal limits  URINALYSIS, ROUTINE W REFLEX MICROSCOPIC - Abnormal; Notable for the following:    Hgb urine dipstick LARGE (*)    All other components within normal limits  URINE MICROSCOPIC-ADD ON  POCT PREGNANCY, URINE   Dg Chest Port 1 View  01/25/2013  *RADIOLOGY REPORT*  Clinical Data: Fever, cough and emesis.  PORTABLE CHEST - 1 VIEW  Comparison: None.  Findings: The lungs are well-aerated.  Minimal apparent left basilar opacity could reflect mild pneumonia.  There is no evidence of pleural effusion or pneumothorax.  The cardiomediastinal silhouette is within normal limits.  No acute osseous abnormalities are seen.  IMPRESSION: Minimal apparent left basilar opacity could reflect mild pneumonia.   Original Report Authenticated By: Tonia Ghent, M.D.      1. Mastitis   2. Bronchitis       MDM          Benny Lennert, MD 01/25/13 [id number]

## 2014-08-25 ENCOUNTER — Ambulatory Visit (INDEPENDENT_AMBULATORY_CARE_PROVIDER_SITE_OTHER): Payer: Self-pay | Admitting: Family Medicine

## 2014-08-25 ENCOUNTER — Encounter: Payer: Self-pay | Admitting: Family Medicine

## 2014-08-25 VITALS — BP 103/69 | HR 74 | Temp 98.5°F | Wt 170.0 lb

## 2014-08-25 DIAGNOSIS — M25561 Pain in right knee: Secondary | ICD-10-CM | POA: Insufficient documentation

## 2014-08-25 DIAGNOSIS — M25569 Pain in unspecified knee: Secondary | ICD-10-CM

## 2014-08-25 DIAGNOSIS — M79609 Pain in unspecified limb: Secondary | ICD-10-CM

## 2014-08-25 DIAGNOSIS — M79671 Pain in right foot: Secondary | ICD-10-CM

## 2014-08-25 NOTE — Assessment & Plan Note (Signed)
Most likely contusion 2/2 small trauma she does not remember. US of the area does not show inflammation of the deep or superficial inferior patellar bursa and minimal fluid present along the patellar tendon.  No doppler flow noticed on [place].  Recommend RICE along with ibuprofen and tylenol PRN.  F/U if no improvement in 2-3 weeks.

## 2014-08-25 NOTE — Assessment & Plan Note (Signed)
Unsure of etiology but w/o inciting injury or previous injury would favor biomechanical process with some me tarsalgia and bunionette process.  Recommend using more supportive shoes for her arches and f/u in 3-4 weeks if no improvement for MT pad/sports insoles.

## 2014-08-25 NOTE — Progress Notes (Signed)
Miranda Downs is a 33 y.o. female who presents today for R knee pain and R foot pain.  Pt states the pain is non descript, beginning about 4 days ago.  She has never injured either of the foot or knee before.  Pain in the knee is worse with ascending/descending the stairs, very minimal at that time.  She denies locking, catching, giving way, or injury.  She has not tried anything for this and denies fever, chills, or sweats.  R foot pain located on plantar aspect of R foot around the 3rd MT head.  She has pain with palpation and sometimes with prolonged walking.  No recent changes in shoes or exercise.    Past Medical History  Diagnosis Date  . No pertinent past medical history     History  Smoking status  . Never Smoker   Smokeless tobacco  . Never Used    Family History  Problem Relation Age of Onset  . Hypertension Father   . Diabetes Father     Current Outpatient Prescriptions on File Prior to Visit  Medication Sig Dispense Refill  . acetaminophen (TYLENOL) 325 MG tablet Take 650 mg by mouth every 6 (six) hours as needed for pain.      Marland Kitchen HYDROcodone-acetaminophen (NORCO/VICODIN) 5-325 MG per tablet Take 1 tablet by mouth every 6 (six) hours as needed for pain (prn pain not helped by motrin).  15 tablet  0  . lanolin ointment Apply topically as needed for dry skin.  454 g  0  . sulfamethoxazole-trimethoprim (SEPTRA DS) 800-160 MG per tablet Take 1 tablet by mouth every 12 (twelve) hours.  20 tablet  0   No current facility-administered medications on file prior to visit.    ROS: Per HPI.  All other systems reviewed and are negative.   Physical Exam Filed Vitals:   08/25/14 0937  BP: 103/69  Pulse: 74  Temp: 98.5 F (36.9 C)    Physical Examination: General appearance - alert, well appearing, and in no distress MSK: R Knee - Knee: Normal inspection of the knee with small contusion around the tibial tubercle  TTP at the patellar tendon insertion into the tibial  tubercle  R foot - Nml inspection TTP at the 3rd MT head  Loss of transverse arch with splaying of the toes

## 2014-08-25 NOTE — Patient Instructions (Signed)
Please use 400-600 mg of Ibuprofen every 6-8 hours as needed for pain.  Follow up in 2-3 weeks if no improvement seen.  Thanks, Dr. Paulina Fusi

## 2014-10-12 ENCOUNTER — Encounter: Payer: Self-pay | Admitting: Family Medicine

## 2014-12-28 ENCOUNTER — Ambulatory Visit (INDEPENDENT_AMBULATORY_CARE_PROVIDER_SITE_OTHER): Payer: 59 | Admitting: Family Medicine

## 2014-12-28 VITALS — BP 118/80 | HR 80 | Temp 99.1°F | Resp 18 | Ht 62.75 in | Wt 177.6 lb

## 2014-12-28 DIAGNOSIS — J029 Acute pharyngitis, unspecified: Secondary | ICD-10-CM

## 2014-12-28 DIAGNOSIS — J069 Acute upper respiratory infection, unspecified: Secondary | ICD-10-CM

## 2014-12-28 LAB — POCT CBC
GRANULOCYTE PERCENT: 63.7 % (ref 37–80)
HCT, POC: 40 % (ref 37.7–47.9)
HEMOGLOBIN: 12.2 g/dL (ref 12.2–16.2)
LYMPH, POC: 2.1 (ref 0.6–3.4)
MCH, POC: 22.2 pg — AB (ref 27–31.2)
MCHC: 30.4 g/dL — AB (ref 31.8–35.4)
MCV: 72.9 fL — AB (ref 80–97)
MID (cbc): 0.4 (ref 0–0.9)
MPV: 10.1 fL (ref 0–99.8)
PLATELET COUNT, POC: 199 10*3/uL (ref 142–424)
POC Granulocyte: 4.3 (ref 2–6.9)
POC LYMPH PERCENT: 30.8 %L (ref 10–50)
POC MID %: 5.5 % (ref 0–12)
RBC: 5.49 M/uL — AB (ref 4.04–5.48)
RDW, POC: 15.9 %
WBC: 6.7 10*3/uL (ref 4.6–10.2)

## 2014-12-28 LAB — POCT RAPID STREP A (OFFICE): RAPID STREP A SCREEN: NEGATIVE

## 2014-12-28 MED ORDER — HYDROCODONE-HOMATROPINE 5-1.5 MG/5ML PO SYRP
5.0000 mL | ORAL_SOLUTION | Freq: Three times a day (TID) | ORAL | Status: DC | PRN
Start: 2014-12-28 — End: 2015-06-19

## 2014-12-28 NOTE — Progress Notes (Addendum)
[facility] [address], [place] [address] [phone]  Date:  12/28/2014   Name:  Miranda SimmerSamira Downs   DOB:  1981/07/29   MRN:  [id number]  PCP:  Everlene Otherook, Jayce, DO    Chief Complaint: Sore Throat; Laryngitis; Otalgia; Nasal Congestion; and Cough   History of Present Illness:  Miranda Downs is a 34 y.o. very pleasant female patient who presents with the following:  She has been sick for about 3 days. She notes a ST, ears hurting, feeling dizzy.  She also has a runny nose and some cough, headache.   She has felt hot and cold.   No GI symptoms.   She is generally in good health.   NKDA She seems to be getting continuously worse.   She has 3 sons and is married- however no one else is sick at home.   LMP was 3 weeks ago- she has a mirena IUD   Patient Active Problem List   Diagnosis Date Noted  . Right knee pain 08/25/2014  . Right foot pain 08/25/2014  . Muscle spasm of back 05/03/2012  . Supervision of normal pregnancy 11/07/2011  . Sickle cell trait 11/07/2011  . Microcytic anemia 11/07/2011    Past Medical History  Diagnosis Date  . No pertinent past medical history     Past Surgical History  Procedure Laterality Date  . No past surgeries      History  Substance Use Topics  . Smoking status: Never Smoker   . Smokeless tobacco: Never Used  . Alcohol Use: No    Family History  Problem Relation Age of Onset  . Hypertension Father   . Diabetes Father     No Known Allergies  Medication list has been reviewed and updated.  No current outpatient prescriptions on file prior to visit.   No current facility-administered medications on file prior to visit.    Review of Systems:  As per HPI- otherwise negative.   Physical Examination: Filed Vitals:   12/28/14 1627  BP: 118/80  Pulse: 80  Temp: 99.1 F (37.3 C)  Resp: 18   Filed Vitals:   12/28/14 1627  Height: 5' 2.75" (1.594 m)  Weight: 177 lb 9.6 oz (80.559 kg)    Body mass index is 31.71 kg/(m^2). Ideal Body Weight: Weight in (lb) to have BMI = 25: 139.7  GEN: WDWN, NAD, Non-toxic, A & O x 3, overweight, looks well HEENT: Atraumatic, Normocephalic. Neck supple. No masses, mild tender cervical LAD.  Bilateral TM wnl, oropharynx erythematous but no exudate.  PEERL,EOMI.   Ears and Nose: No external deformity. CV: RRR, No M/G/R. No JVD. No thrill. No extra heart sounds. PULM: CTA B, no wheezes, crackles, rhonchi. No retractions. No resp. distress. No accessory muscle use. ABD: S, NT, ND. No rebound. No HSM. EXTR: No c/c/e NEURO Normal gait.  PSYCH: Normally interactive. Conversant. Not depressed or anxious appearing.  Calm demeanor.   Results for orders placed or performed in visit on 12/28/14  POCT CBC  Result Value Ref Range   WBC 6.7 4.6 - 10.2 K/uL   Lymph, poc 2.1 0.6 - 3.4   POC LYMPH PERCENT 30.8 10 - 50 %L   MID (cbc) 0.4 0 - 0.9   POC MID % 5.5 0 - 12 %M   POC Granulocyte 4.3 2 - 6.9   Granulocyte percent 63.7 37 - 80 %G   RBC 5.49 (A) 4.04 - 5.48 M/uL   Hemoglobin 12.2 12.2 -  16.2 g/dL   HCT, POC 16.1 09.6 - 47.9 %   MCV 72.9 (A) 80 - 97 fL   MCH, POC 22.2 (A) 27 - 31.2 pg   MCHC 30.4 (A) 31.8 - 35.4 g/dL   RDW, POC 04.5 %   Platelet Count, POC 199 142 - 424 K/uL   MPV 10.1 0 - 99.8 fL  POCT rapid strep A  Result Value Ref Range   Rapid Strep A Screen Negative Negative     Assessment and Plan: Acute pharyngitis, unspecified pharyngitis type - Plan: POCT CBC, POCT rapid strep A, Throat culture ([facility]), HYDROcodone-homatropine (HYCODAN) 5-1.5 MG/5ML syrup  Viral URI  Here today with likely viral URI.  CBC is reassuring that this is not bacterial and her strep is negative.  Doubt flu as she has just a mild cough. Treat supportively, hycodan as needed (cuationed regadring sedation)   Asked her to let me know if not better in the next couple of days- Sooner if worse.   Throat culture pending  Signed Abbe Amsterdam,  MD  1/2- throat culture is negative- called to let her know.  LMOM

## 2014-12-28 NOTE — Patient Instructions (Signed)
Drink plenty of fluids, eat a bland diet, and use ibuprofen and/ or tylenol for your aches and fatigue.   Use the cough syrup as needed -this will help you to sleep but do not use it when you need to drive.    Let me know if you are not feeling better in the next 2 days

## 2014-12-30 LAB — CULTURE, GROUP A STREP: ORGANISM ID, BACTERIA: NORMAL

## 2015-06-19 ENCOUNTER — Emergency Department (HOSPITAL_COMMUNITY): Payer: Medicaid Other

## 2015-06-19 ENCOUNTER — Encounter (HOSPITAL_COMMUNITY): Payer: Self-pay | Admitting: *Deleted

## 2015-06-19 ENCOUNTER — Emergency Department (HOSPITAL_COMMUNITY)
Admission: EM | Admit: 2015-06-19 | Discharge: 2015-06-20 | Discharge status: Against Medical Advice | Payer: Medicaid Other | Attending: Emergency Medicine | Admitting: Emergency Medicine

## 2015-06-19 DIAGNOSIS — R0602 Shortness of breath: Secondary | ICD-10-CM | POA: Insufficient documentation

## 2015-06-19 DIAGNOSIS — J069 Acute upper respiratory infection, unspecified: Secondary | ICD-10-CM

## 2015-06-19 DIAGNOSIS — B9789 Other viral agents as the cause of diseases classified elsewhere: Secondary | ICD-10-CM

## 2015-06-19 DIAGNOSIS — R000 Tachycardia, unspecified: Secondary | ICD-10-CM | POA: Insufficient documentation

## 2015-06-19 MED ORDER — IPRATROPIUM-ALBUTEROL 0.5-2.5 (3) MG/3ML IN SOLN
3.0000 mL | RESPIRATORY_TRACT | Status: DC
  Administered 2015-06-20: 3 mL via RESPIRATORY_TRACT
  Filled 2015-06-19: qty 3

## 2015-06-19 MED ORDER — DM-GUAIFENESIN ER 30-600 MG PO TB12
1.0000 | ORAL_TABLET | ORAL | Status: AC
  Administered 2015-06-20: 1 via ORAL
  Filled 2015-06-19: qty 1

## 2015-06-19 MED ORDER — BENZONATATE 100 MG PO CAPS
200.0000 mg | ORAL_CAPSULE | Freq: Once | ORAL | Status: AC
  Administered 2015-06-20: 200 mg via ORAL
  Filled 2015-06-19: qty 2

## 2015-06-19 MED ORDER — IBUPROFEN 800 MG PO TABS
800.0000 mg | ORAL_TABLET | Freq: Once | ORAL | Status: AC
  Administered 2015-06-20: 800 mg via ORAL
  Filled 2015-06-19: qty 1

## 2015-06-19 NOTE — ED Provider Notes (Signed)
CSN: [id number]     Arrival date & time 06/19/15  2157 History  This chart was scribed for Tomasita Crumble, MD by Evon Slack, ED Scribe. This patient was seen in room D32C/D32C and the patient's care was started at 11:43 PM.      Chief Complaint  Patient presents with  . Generalized Body Aches   Patient is a 34 y.o. female presenting with cough. The history is provided by the patient. No language interpreter was used.  Cough Severity:  Moderate Onset quality:  Gradual Duration:  2 days Timing:  Intermittent Chronicity:  New Context: not sick contacts   Relieved by:  None tried Worsened by:  Nothing tried Ineffective treatments:  None tried Associated symptoms: ear pain, fever, headaches and sore throat    HPI Comments: Miranda Downs is a 34 y.o. female who presents to the Emergency Department complaining of cough onset 2 days prior. Pt states that she has associated subjective fever, SOB, sore throat, ear pain and HA located behind her eyes.Pt states that her air condition recently stopped working and the heat is making it harder for her to breath. Pt doesn't report any medications PTA. Pt denies any recent sick contacts. Pt doesn't report any alleviating factors. Pt denies abdominal pain, nausea, vomiting or diarrhea.   Past Medical History  Diagnosis Date  . No pertinent past medical history    Past Surgical History  Procedure Laterality Date  . No past surgeries     Family History  Problem Relation Age of Onset  . Hypertension Father   . Diabetes Father    History  Substance Use Topics  . Smoking status: Never Smoker   . Smokeless tobacco: Never Used  . Alcohol Use: No   OB History    Gravida Para Term Preterm AB TAB SAB Ectopic Multiple Living   0 0 0 0 0 0 3      Review of Systems  Constitutional: Positive for fever.  HENT: Positive for ear pain and sore throat.   Respiratory: Positive for cough.   Gastrointestinal: Negative for nausea, vomiting,  abdominal pain and diarrhea.  Neurological: Positive for headaches.  All other systems reviewed and are negative.     Allergies  Review of patient's allergies indicates no known allergies.  Home Medications   Prior to Admission medications   Medication Sig Start Date End Date Taking? Authorizing Provider  HYDROcodone-homatropine (HYCODAN) 5-1.5 MG/5ML syrup Take 5 mLs by mouth every 8 (eight) hours as needed for cough. 12/28/14   Gwenlyn Found Copland, MD   BP 110/77 mmHg  Pulse 117  Temp(Src) 99.4 F (37.4 C)  Resp 24  SpO2 100%  LMP 06/12/2015   Physical Exam  Constitutional: She is oriented to person, place, and time. She appears well-developed and well-nourished. No distress.  HENT:  Head: Normocephalic and atraumatic.  Nose: Nose normal.  Mouth/Throat: Oropharynx is clear and moist. No oropharyngeal exudate.  Eyes: Conjunctivae and EOM are normal. Pupils are equal, round, and reactive to light. No scleral icterus.  Neck: Normal range of motion. Neck supple. No JVD present. No tracheal deviation present. No thyromegaly present.  Cardiovascular: Regular rhythm and normal heart sounds.  Tachycardia present.  Exam reveals no gallop and no friction rub.   No murmur heard. Pulmonary/Chest: Effort normal and breath sounds normal. No respiratory distress. She has no wheezes. She exhibits no tenderness.  Abdominal: Soft. Bowel sounds are normal. She exhibits no distension and no mass. There is  no tenderness. There is no rebound and no guarding.  Musculoskeletal: Normal range of motion. She exhibits no edema or tenderness.  Lymphadenopathy:    She has no cervical adenopathy.  Neurological: She is alert and oriented to person, place, and time. No cranial nerve deficit. She exhibits normal muscle tone.  Skin: Skin is warm and dry. No rash noted. No erythema. No pallor.  Nursing note and vitals reviewed.   ED Course  Procedures (including critical care time) DIAGNOSTIC  STUDIES: Oxygen Saturation is 100% on RA, normal by my interpretation.    COORDINATION OF CARE: 11:49 PM-Discussed treatment plan with pt at bedside and pt agreed to plan.     Labs Review Labs Reviewed - No data to display  Imaging Review Dg Chest 2 View  06/19/2015   CLINICAL DATA:  Chest congestion and cough  EXAM: CHEST  2 VIEW  COMPARISON:  01/15/2013  FINDINGS: Normal heart size and mediastinal contours. No acute infiltrate or edema. No effusion or pneumothorax. No acute osseous findings.  IMPRESSION: Negative chest.   Electronically Signed   By: Marnee SpringJonathon  Watts M.D.   On: 06/19/2015 23:34     EKG Interpretation   Date/Time:  Saturday June 19 2015 22:02:51 EDT Ventricular Rate:  114 PR Interval:  150 QRS Duration: 80 QT Interval:  306 QTC Calculation: 421 R Axis:   82 Text Interpretation:  Sinus tachycardia Nonspecific T wave abnormality  Abnormal ECG Confirmed by Erroll Lunani, Trevell Pariseau Ayokunle [id number]) on 06/19/2015  11:41:35 PM      MDM   Final diagnoses:  None    Patient presents to the ED for viral URI like symptoms.  CXR negative for pneumonia.  Physical exam unremarkable aside from tachycardia.  She is refusing IV and fluid bolus because she needs to get home to her children.  Will provide oral rehydration, give breathing treatment, mucinex, tessalon pearles, and motrin for symptomatic control.  She was educated on viral URI diagnosis.  She otherwise appears well and in NAD.  She is refusing to stay until tachycardia resolves.  She is leaving AMA and has decision making capacity.  She needs to see her children before bed.  She will be DC with PCP fu advised within 3 days and strict return precautions.    I personally performed the services described in this documentation, which was scribed in my presence. The recorded information has been reviewed and is accurate.    Tomasita CrumbleAdeleke Mathea Frieling, MD 06/20/15 [phone]

## 2015-06-19 NOTE — ED Notes (Signed)
The pt has a coild cough sore throat and she has chest congestion for 2 days with a headache  lmp 2 weeks ago

## 2015-06-20 NOTE — ED Notes (Signed)
Instructed patient she needed to stay for fluids for hydration.  Patient stated she did not want to stay because she has a child at home and needs to get home.  Told her she was leaving AMA and she voiced understanding

## 2015-06-20 NOTE — Discharge Instructions (Signed)
Upper Respiratory Infection, Adult Miranda Downs, you were seen today for cold symptoms.  Your xray is negative for pneumonia.  Continue to use ibuprofen 800mg  3 times per day when you have pain.  See a primary care doctor within 3 days for close follow up.  Stay well hydrated, and if symptoms worsen, come back to the ED immediately.  Thank you.   An upper respiratory infection (URI) is also known as the common cold. It is often caused by a type of germ (virus). Colds are easily spread (contagious). You can pass it to others by kissing, coughing, sneezing, or drinking out of the same glass. Usually, you get better in 1 or 2 weeks.  HOME CARE   Only take medicine as told by your doctor.  Use a warm mist humidifier or breathe in steam from a hot shower.  Drink enough water and fluids to keep your pee (urine) clear or pale yellow.  Get plenty of rest.  Return to work when your temperature is back to normal or as told by your doctor. You may use a face mask and wash your hands to stop your cold from spreading. GET HELP RIGHT AWAY IF:   After the first few days, you feel you are getting worse.  You have questions about your medicine.  You have chills, shortness of breath, or brown or red spit (mucus).  You have yellow or brown snot (nasal discharge) or pain in the face, especially when you bend forward.  You have a fever, puffy (swollen) neck, pain when you swallow, or white spots in the back of your throat.  You have a bad headache, ear pain, sinus pain, or chest pain.  You have a high-pitched whistling sound when you breathe in and out (wheezing).  You have a lasting cough or cough up blood.  You have sore muscles or a stiff neck. MAKE SURE YOU:   Understand these instructions.  Will watch your condition.  Will get help right away if you are not doing well or get worse. Document Released: 05/15/2008 Document Revised: 02/19/2012 Document Reviewed: 03/04/2014 [facility] Patient  Information 2015 [place]. This information is not intended to replace advice given to you by your health care provider. Make sure you discuss any questions you have with your health care provider.

## 2015-11-10 ENCOUNTER — Telehealth: Payer: Self-pay | Admitting: General Practice

## 2015-11-10 NOTE — Telephone Encounter (Signed)
Pt called requesting an appt to have IUD removed. Pt has only been seen once since 04/2012. Per Shanda BumpsJessica, patient can make an appt with any provider that has an opening soon and assign to that provider. Pt to be aware this appt is to discuss birth control options and she may or may not have the IUD removed that day. Left pt vm to return call to relay this message.

## 2015-11-15 ENCOUNTER — Ambulatory Visit (INDEPENDENT_AMBULATORY_CARE_PROVIDER_SITE_OTHER): Payer: Self-pay | Admitting: Internal Medicine

## 2015-11-15 ENCOUNTER — Encounter: Payer: Self-pay | Admitting: Internal Medicine

## 2015-11-15 VITALS — BP 108/67 | HR 102 | Temp 98.5°F | Ht 62.75 in | Wt 186.0 lb

## 2015-11-15 DIAGNOSIS — Z538 Procedure and treatment not carried out for other reasons: Secondary | ICD-10-CM

## 2015-11-15 DIAGNOSIS — Z975 Presence of (intrauterine) contraceptive device: Secondary | ICD-10-CM

## 2015-11-15 NOTE — Progress Notes (Signed)
Patient ID: Miranda Downs, female   DOB: 1981/03/11, 34 y.o.   MRN: [id number] Date of Visit: 11/15/2015   HPI:  Patient is here for IUD removal (placed 03/2012). Notes she is ready to have another child. No cramping, vaginal bleeding.   ROS: See HPI.  PHYSICAL EXAM: BP 108/67 mmHg  Pulse 102  Temp(Src) 98.5 F (36.9 C) (Oral)  Ht 5' 2.75" (1.594 m)  Wt 186 lb (84.369 kg)  BMI 33.21 kg/m2  LMP 09/28/2015 Gen:NAD Heart: RRR/ no /m/r/g Lungs: CTAB Speculum Exam: cervial oz visualized, no string from IUD is visualized. Bedside US did verify IUD in uterus.   ASSESSMENT/PLAN:   Attempted IUD removal, unsuccessful Strings not visualized on exam. Bedside US does verify IUD in uterus.  - referral to GYN for IUD removal   FOLLOW UP: F/u PRN  Palma HolterKanishka G Brelan Hannen, MD [facility]

## 2015-11-15 NOTE — Assessment & Plan Note (Signed)
Strings not visualized on exam. Bedside US does verify IUD in uterus.  - referral to GYN for IUD removal

## 2015-11-15 NOTE — Patient Instructions (Signed)
Thank you for coming in. I made a referral to Gynecology as we were unable to remove the IUD today. Our clinic will call you with the date and time for this appointment.

## 2015-11-17 ENCOUNTER — Encounter: Payer: Self-pay | Admitting: Obstetrics & Gynecology

## 2015-12-22 ENCOUNTER — Encounter: Payer: Self-pay | Admitting: Obstetrics & Gynecology

## 2015-12-22 ENCOUNTER — Ambulatory Visit (INDEPENDENT_AMBULATORY_CARE_PROVIDER_SITE_OTHER): Payer: Medicaid Other | Admitting: Obstetrics & Gynecology

## 2015-12-22 VITALS — BP 101/66 | HR 87 | Temp 98.6°F | Resp 20 | Ht 61.02 in | Wt 186.1 lb

## 2015-12-22 DIAGNOSIS — Z30432 Encounter for removal of intrauterine contraceptive device: Secondary | ICD-10-CM

## 2015-12-22 NOTE — Progress Notes (Signed)
   Subjective:    Patient ID: Miranda Downs, female    DOB: [birth year], 35 y.o.   MRN: [id number]  HPI  35 yo obese M P3 is here to have her 35 yo Mirena removed as she wants to conceive.   Review of Systems     Objective:   Physical Exam  WNWHFNAD I prepped her cervix with betadine and used uterine dressing forceps to easily remove her intact Mirena. She tolerated the procedure well.      Assessment & Plan:  Desire for pregnancy- rec MVI daily

## 2015-12-22 NOTE — Progress Notes (Signed)
Pt here for IUD removal - [facility] be [facility].  Pt desires pregnancy.

## 2016-05-28 ENCOUNTER — Inpatient Hospital Stay (HOSPITAL_COMMUNITY): Payer: Medicaid Other

## 2016-05-28 ENCOUNTER — Inpatient Hospital Stay (HOSPITAL_COMMUNITY)
Admission: AD | Admit: 2016-05-28 | Discharge: 2016-05-28 | Disposition: A | Discharge status: Home / Self Care | Payer: Medicaid Other | Source: Ambulatory Visit | Attending: Obstetrics and Gynecology | Admitting: Obstetrics and Gynecology

## 2016-05-28 ENCOUNTER — Encounter (HOSPITAL_COMMUNITY): Payer: Self-pay | Admitting: *Deleted

## 2016-05-28 DIAGNOSIS — R109 Unspecified abdominal pain: Secondary | ICD-10-CM | POA: Insufficient documentation

## 2016-05-28 DIAGNOSIS — Z3A13 13 weeks gestation of pregnancy: Secondary | ICD-10-CM | POA: Diagnosis not present

## 2016-05-28 DIAGNOSIS — O209 Hemorrhage in early pregnancy, unspecified: Secondary | ICD-10-CM | POA: Diagnosis present

## 2016-05-28 DIAGNOSIS — O034 Incomplete spontaneous abortion without complication: Secondary | ICD-10-CM | POA: Diagnosis not present

## 2016-05-28 DIAGNOSIS — O4690 Antepartum hemorrhage, unspecified, unspecified trimester: Secondary | ICD-10-CM

## 2016-05-28 DIAGNOSIS — O26899 Other specified pregnancy related conditions, unspecified trimester: Secondary | ICD-10-CM

## 2016-05-28 LAB — CBC
HEMATOCRIT: 33.4 % — AB (ref 36.0–46.0)
HEMOGLOBIN: 10.9 g/dL — AB (ref 12.0–15.0)
MCH: 22.4 pg — AB (ref 26.0–34.0)
MCHC: 32.6 g/dL (ref 30.0–36.0)
MCV: 68.6 fL — ABNORMAL LOW (ref 78.0–100.0)
Platelets: 158 10*3/uL (ref 150–400)
RBC: 4.87 MIL/uL (ref 3.87–5.11)
RDW: 16.2 % — ABNORMAL HIGH (ref 11.5–15.5)
WBC: 7.5 10*3/uL (ref 4.0–10.5)

## 2016-05-28 LAB — URINE MICROSCOPIC-ADD ON

## 2016-05-28 LAB — URINALYSIS, ROUTINE W REFLEX MICROSCOPIC
Bilirubin Urine: NEGATIVE
GLUCOSE, UA: NEGATIVE mg/dL
KETONES UR: NEGATIVE mg/dL
LEUKOCYTES UA: NEGATIVE
NITRITE: NEGATIVE
PH: 5 (ref 5.0–8.0)
PROTEIN: 30 mg/dL — AB
Specific Gravity, Urine: >1.03 — ABNORMAL HIGH (ref 1.005–1.030)

## 2016-05-28 LAB — HCG, QUANTITATIVE, PREGNANCY: HCG, BETA CHAIN, QUANT, S: 10664 m[IU]/mL — AB (ref <5)

## 2016-05-28 LAB — POCT PREGNANCY, URINE: Preg Test, Ur: POSITIVE — AB

## 2016-05-28 MED ORDER — MISOPROSTOL 200 MCG PO TABS: 800.0000 ug | ORAL_TABLET | Freq: Once | ORAL | Status: DC

## 2016-05-28 MED ORDER — OXYCODONE-ACETAMINOPHEN 5-325 MG PO TABS
2.0000 | ORAL_TABLET | Freq: Once | ORAL | Status: AC
  Administered 2016-05-28: 2 via ORAL
  Filled 2016-05-28: qty 2

## 2016-05-28 MED ORDER — OXYCODONE-ACETAMINOPHEN 5-325 MG PO TABS: 1.0000 | ORAL_TABLET | Freq: Four times a day (QID) | ORAL | Status: DC | PRN

## 2016-05-28 NOTE — Discharge Instructions (Signed)
Incomplete Miscarriage A miscarriage is the sudden loss of an unborn baby (fetus) before the 20th week of pregnancy. In an incomplete miscarriage, parts of the fetus or placenta (afterbirth) remain in the body.  Having a miscarriage can be an emotional experience. Talk with your health care provider about any questions you may have about miscarrying, the grieving process, and your future pregnancy plans. CAUSES   Problems with the fetal chromosomes that make it impossible for the baby to develop normally. Problems with the baby's genes or chromosomes are most often the result of errors that occur by chance as the embryo divides and grows. The problems are not inherited from the parents.  Infection of the cervix or uterus.  Hormone problems.  Problems with the cervix, such as having an incompetent cervix. This is when the tissue in the cervix is not strong enough to hold the pregnancy.  Problems with the uterus, such as an abnormally shaped uterus, uterine fibroids, or congenital abnormalities.  Certain medical conditions.  Smoking, drinking alcohol, or taking illegal drugs.  Trauma. SYMPTOMS   Vaginal bleeding or spotting, with or without cramps or pain.  Pain or cramping in the abdomen or lower back.  Passing fluid, tissue, or blood clots from the vagina. DIAGNOSIS  Your health care provider will perform a physical exam. You may also have an ultrasound to confirm the miscarriage. Blood or urine tests may also be ordered. TREATMENT   Usually, a dilation and curettage (D&C) procedure is performed. During a D&C procedure, the cervix is widened (dilated) and any remaining fetal or placental tissue is gently removed from the uterus.  Antibiotic medicines are prescribed if there is an infection. Other medicines may be given to reduce the size of the uterus (contract) if there is a lot of bleeding.  If you have Rh negative blood and your baby was Rh positive, you will need a Rho (D)  immune globulin shot. This shot will protect any future baby from having Rh blood problems in future pregnancies.  You may be confined to bed rest. This means you should stay in bed and only get up to use the bathroom. HOME CARE INSTRUCTIONS   Rest as directed by your health care provider.  Restrict activity as directed by your health care provider. You may be allowed to continue light activity if curettage was not done but you require further treatment.  Keep track of the number of pads you use each day. Keep track of how soaked (saturated) they are. Record this information.  Do not  use tampons.  Do not douche or have sexual intercourse until approved by your health care provider.  Keep all follow-up appointments for reevaluation and continuing management.  Only take over-the-counter or prescription medicines for pain, fever, or discomfort as directed by your health care provider.  Take antibiotic medicine as directed by your health care provider. Make sure you finish it even if you start to feel better. SEEK IMMEDIATE MEDICAL CARE IF:   You experience severe cramps in your stomach, back, or abdomen.  You have an unexplained temperature (make sure to record these temperatures).  You pass large clots or tissue (save these for your health care provider to inspect).  Your bleeding increases.  You become light-headed, weak, or have fainting episodes. MAKE SURE YOU:   Understand these instructions.  Will watch your condition.  Will get help right away if you are not doing well or get worse.   This information is not intended to   replace advice given to you by your health care provider. Make sure you discuss any questions you have with your health care provider.   Document Released: 11/27/2005 Document Revised: 12/18/2014 Document Reviewed: 06/26/2013 Elsevier Interactive Patient Education 2016 [employer]vier Inc.  

## 2016-05-28 NOTE — MAU Note (Signed)
Pt [facility], pt thought she was 13 weeks, sonographer told her she was 7 weeks.  Was also told there was no heart beat.  MD called her later than evening & told her that BHCG was 6000.  Started bleeding yesterday, become heavier today.  Also lower abd pain.

## 2016-05-28 NOTE — MAU Provider Note (Signed)
Chief Complaint: Vaginal Bleeding and Abdominal Pain   First Provider Initiated Contact with Patient 05/28/16 1442     SUBJECTIVE HPI: Miranda Downs is a 35 y.o. G4P3003 at [redacted]w[redacted]d by certain LMP who presents to Maternity Admissions reporting vaginal bleeding and severe cramping since this morning. Was seen a [facility] on 05/25/2016. Should have been 12+ weeks gestation at that visit, but ultrasound showed a 7 week fetus with no cardiac activity per prenatal record and patient. Management options were discussed, but patient requested to have hCG drawn and repeated a few days later to be sure that this is truly a failed pregnancy. Patient reports that hCG level was 6000. It is not yet available in her prenatal record.  Location: Suprapubic Quality: Cramping Severity: 9/10 on pain scale Duration: Less than 12 hours Context: With bleeding Timing: Intermittent Modifying factors: Nothing. Hasn't tried anything for the pain. Associated signs and symptoms: Positive for vaginal bleeding. Negative for fever, chills, GI complaints, urinary complaints, passage of tissue. Has passed small clots.  Past Medical History  Diagnosis Date  . No pertinent past medical history    OB History  Gravida Para Term Preterm AB SAB TAB Ectopic Multiple Living  0 0 0 0 0 0 3    # Outcome Date GA Lbr Len/2nd Weight Sex Delivery Anes PTL Lv  4 Current           3 Term 02/18/12 [redacted]w[redacted]d 07:45 / 00:10 7 lb 4 oz (3.289 kg) M Vag-Spont None  Y     Comments: WNL  2 Term 02/05/08    M Vag-Spont     1 Term 09/30/05    Heide Scales     Past Surgical History  Procedure Laterality Date  . No past surgeries     Social History   Social History  . Marital Status: Married    Spouse Name: N/A  . Number of Children: N/A  . Years of Education: N/A   Occupational History  . Not on file.   Social History Main Topics  . Smoking status: Never Smoker   . Smokeless tobacco: Never Used  . Alcohol Use: No  .  Drug Use: No  . Sexual Activity: Yes    Birth Control/ Protection: None   Other Topics Concern  . Not on file   Social History Narrative   No current facility-administered medications on file prior to encounter.   No current outpatient prescriptions on file prior to encounter.   No Known Allergies  I have reviewed the past Medical Hx, Surgical Hx, Social Hx, Allergies and Medications.   Review of Systems  Constitutional: Negative for fever and chills.  Gastrointestinal: Positive for abdominal pain. Negative for nausea, vomiting, diarrhea and constipation.  Genitourinary: Positive for vaginal bleeding. Negative for dysuria, hematuria and vaginal discharge.  Musculoskeletal: Negative for back pain.  Neurological: Negative for dizziness.    OBJECTIVE Patient Vitals for the past 24 hrs:  BP Temp Temp src Pulse Resp Height Weight  05/28/16 1613 106/68 mmHg - - 75 16 - -  05/28/16 1352 107/72 mmHg 98.2 F (36.8 C) Oral 73 18  (1.575 m) 194 lb (87.998 kg)   Constitutional: Well-developed, well-nourished female in mild distress.  Cardiovascular: normal rate Respiratory: normal rate and effort.  GI: Abd soft, non-tender. MS: Extremities nontender, no edema, normal ROM Neurologic: Alert and oriented x 4.  GU: Neg CVAT.  SPECULUM EXAM: NEFG, small amount of bright red blood  noted, cervix clean, but incompletely visualized.  BIMANUAL: cervix closed; uterus 8-week size, no adnexal tenderness or masses. No CMT.  LAB RESULTS Results for orders placed or performed during the hospital encounter of 05/28/16 (from the past 24 hour(s))  Urinalysis, Routine w reflex microscopic (not at [facility])     Status: Abnormal   Collection Time: 05/28/16  2:00 PM  Result Value Ref Range   Color, Urine YELLOW YELLOW   APPearance CLEAR CLEAR   Specific Gravity, Urine >1.030 (H) 1.005 - 1.030   pH 5.0 5.0 - 8.0   Glucose, UA NEGATIVE NEGATIVE mg/dL   Hgb urine dipstick LARGE (A) NEGATIVE    Bilirubin Urine NEGATIVE NEGATIVE   Ketones, ur NEGATIVE NEGATIVE mg/dL   Protein, ur 30 (A) NEGATIVE mg/dL   Nitrite NEGATIVE NEGATIVE   Leukocytes, UA NEGATIVE NEGATIVE  Urine microscopic-add on     Status: Abnormal   Collection Time: 05/28/16  2:00 PM  Result Value Ref Range   Squamous Epithelial / LPF 0-5 (A) NONE SEEN   WBC, UA 0-5 0 - 5 WBC/hpf   RBC / HPF TOO NUMEROUS TO COUNT 0 - 5 RBC/hpf   Bacteria, UA RARE (A) NONE SEEN   Urine-Other MUCOUS PRESENT   CBC     Status: Abnormal   Collection Time: 05/28/16  2:46 PM  Result Value Ref Range   WBC 7.5 4.0 - 10.5 K/uL   RBC 4.87 3.87 - 5.11 MIL/uL   Hemoglobin 10.9 (L) 12.0 - 15.0 g/dL   HCT 40.9 (L) 81.1 - 91.4 %   MCV 68.6 (L) 78.0 - 100.0 fL   MCH 22.4 (L) 26.0 - 34.0 pg   MCHC 32.6 30.0 - 36.0 g/dL   RDW 78.2 (H) 95.6 - 21.3 %   Platelets 158 150 - 400 K/uL  hCG, quantitative, pregnancy     Status: Abnormal   Collection Time: 05/28/16  2:46 PM  Result Value Ref Range   hCG, Beta Chain, Quant, S 10664 (H) <5 mIU/mL  Pregnancy, urine POC     Status: Abnormal   Collection Time: 05/28/16  3:30 PM  Result Value Ref Range   Preg Test, Ur POSITIVE (A) NEGATIVE    IMAGING US Ob Comp Less 14 Wks  05/28/2016  CLINICAL DATA:  Vaginal bleeding during pregnancy, antepartum O46.90 (ICD-10-CM) Abdominal pain affecting pregnancy, antepartum O99.89, R10.9 (ICD-10-CM) EXAM: OBSTETRIC <14 WK [place] AND TRANSVAGINAL OB US TECHNIQUE: Both transabdominal and transvaginal ultrasound examinations were performed for complete evaluation of the gestation as well as the maternal uterus, adnexal regions, and pelvic cul-de-sac. Transvaginal technique was performed to assess early pregnancy. COMPARISON:  None. FINDINGS: Intrauterine gestational sac: Elongated gestational sac seen extending in the lower uterine segment into the endocervical canal. Yolk sac:  No Embryo:  No Cardiac Activity: No MSD: 29.4  mm   8 w   0  d Subchorionic hemorrhage:  None  visualized. Maternal uterus/adnexae: No uterine masses. Normal ovaries. No adnexal masses or pelvic free fluid. IMPRESSION: 1. Elongated gestational sac extends from the endometrial canal the lower uterine segment into the endocervical canal. There is no embryo or yolk sac. Mean diameter is 29.4 mm. Findings meet definitive criteria for failed pregnancy. This follows SRU consensus guidelines: Diagnostic Criteria for Nonviable Pregnancy Early in the First Trimester. Macy Mis J Med [phone]. 2. No uterine masses.  Normal ovaries and adnexa. Electronically Signed   By: Amie Portland M.D.   On: 05/28/2016 16:09   US Ob Transvaginal  05/28/2016  CLINICAL DATA:  Vaginal bleeding during pregnancy, antepartum O46.90 (ICD-10-CM) Abdominal pain affecting pregnancy, antepartum O99.89, R10.9 (ICD-10-CM) EXAM: OBSTETRIC <14 WK US AND TRANSVAGINAL OB US TECHNIQUE: Both transabdominal and transvaginal ultrasound examinations were performed for complete evaluation of the gestation as well as the maternal uterus, adnexal regions, and pelvic cul-de-sac. Transvaginal technique was performed to assess early pregnancy. COMPARISON:  None. FINDINGS: Intrauterine gestational sac: Elongated gestational sac seen extending in the lower uterine segment into the endocervical canal. Yolk sac:  No Embryo:  No Cardiac Activity: No MSD: 29.4  mm   8 w   0  d Subchorionic hemorrhage:  None visualized. Maternal uterus/adnexae: No uterine masses. Normal ovaries. No adnexal masses or pelvic free fluid. IMPRESSION: 1. Elongated gestational sac extends from the endometrial canal the lower uterine segment into the endocervical canal. There is no embryo or yolk sac. Mean diameter is 29.4 mm. Findings meet definitive criteria for failed pregnancy. This follows SRU consensus guidelines: Diagnostic Criteria for Nonviable Pregnancy Early in the First Trimester. Macy Mis Engl J Med [phone];369:1443-51. 2. No uterine masses.  Normal ovaries and adnexa.  Electronically Signed   By: Amie Portlandavid  Ormond M.D.   On: 05/28/2016 16:09    MAU COURSE UA, Hgc, UA, CBC, percocet.  Discussed confirmation of failed pregnancy w/ pt , family and Dr. Ellyn HackBovard. Discussed options for management of incomplete AB including expectant management, Cytotec or D&C. Prefers Cytotec at this time. Verbalizes understanding that intervention may become necessary if SAB in not completed after 2 doses or if heavy bleeding or infection occur.   MDM - Incomplete AB. Hemodynamically stable.  ASSESSMENT 1. Incomplete miscarriage   2. Vaginal bleeding during pregnancy, antepartum   3. Abdominal pain affecting pregnancy, antepartum     PLAN Discharge home in stable condition. Bleeding Precautions Support given. Declines Chaplain     Follow-up Information    Follow up with Edwinna Areolaecilia Worema Banga, DO On 05/29/2016.   Specialty:  Obstetrics and Gynecology   Why:  As scheduled   Contact information:   [address] [place] [address] [phone]       Follow [facility] with [facility] [place] [facility].   Why:  As needed in emergencies   Contact information:   [address] [id number] [place] [id number] [phone]       Medication List    TAKE these medications        misoprostol 200 MCG tablet  Commonly known as:  CYTOTEC  Take 4 tablets (800 mcg total) by mouth once. Place tablets between cheek and gums and let them dissolve. If you do not pass pregnancy tissue within 24 hours, take second dose. If you do not pass pregnancy tissue within 24 hours after 2nd dose call the office.     oxyCODONE-acetaminophen 5-325 MG tablet  Commonly known as:  PERCOCET/ROXICET  Take 1 tablet by mouth every 6 (six) hours as needed for severe pain.     PREPLUS 27-1 MG Tabs  Take 1 tablet by mouth daily.         [place] Harvin Kon[place], [place] 05/28/2016  5:36 PM

## 2016-05-29 ENCOUNTER — Encounter (HOSPITAL_COMMUNITY): Payer: Self-pay | Admitting: *Deleted

## 2016-05-29 ENCOUNTER — Inpatient Hospital Stay (HOSPITAL_COMMUNITY): Payer: Medicaid Other | Admitting: Anesthesiology

## 2016-05-29 ENCOUNTER — Encounter (HOSPITAL_COMMUNITY)
Admission: AD | Disposition: A | Discharge status: Home / Self Care | Payer: Self-pay | Source: Ambulatory Visit | Attending: Obstetrics and Gynecology

## 2016-05-29 ENCOUNTER — Inpatient Hospital Stay (HOSPITAL_COMMUNITY): Payer: Medicaid Other

## 2016-05-29 ENCOUNTER — Inpatient Hospital Stay (HOSPITAL_COMMUNITY)
Admission: AD | Admit: 2016-05-29 | Discharge: 2016-05-29 | Disposition: A | Discharge status: Home / Self Care | Payer: Medicaid Other | Source: Ambulatory Visit | Attending: Obstetrics and Gynecology | Admitting: Obstetrics and Gynecology

## 2016-05-29 DIAGNOSIS — O021 Missed abortion: Secondary | ICD-10-CM | POA: Diagnosis not present

## 2016-05-29 DIAGNOSIS — Z3A13 13 weeks gestation of pregnancy: Secondary | ICD-10-CM | POA: Insufficient documentation

## 2016-05-29 DIAGNOSIS — D62 Acute posthemorrhagic anemia: Secondary | ICD-10-CM | POA: Diagnosis not present

## 2016-05-29 LAB — CBC
HCT: 24 % — ABNORMAL LOW (ref 36.0–46.0)
HEMATOCRIT: 29.2 % — AB (ref 36.0–46.0)
HEMOGLOBIN: 8 g/dL — AB (ref 12.0–15.0)
Hemoglobin: 9.3 g/dL — ABNORMAL LOW (ref 12.0–15.0)
MCH: 21.9 pg — AB (ref 26.0–34.0)
MCH: 22.9 pg — AB (ref 26.0–34.0)
MCHC: 31.8 g/dL (ref 30.0–36.0)
MCHC: 33.3 g/dL (ref 30.0–36.0)
MCV: 68.7 fL — AB (ref 78.0–100.0)
MCV: 68.6 fL — AB (ref 78.0–100.0)
PLATELETS: 176 10*3/uL (ref 150–400)
Platelets: 121 10*3/uL — ABNORMAL LOW (ref 150–400)
RBC: 4.25 MIL/uL (ref 3.87–5.11)
RBC: 3.5 MIL/uL — AB (ref 3.87–5.11)
RDW: 15.8 % — AB (ref 11.5–15.5)
RDW: 15.7 % — ABNORMAL HIGH (ref 11.5–15.5)
WBC: 8.8 10*3/uL (ref 4.0–10.5)
WBC: 7.9 10*3/uL (ref 4.0–10.5)

## 2016-05-29 LAB — ABO/RH: ABO/RH(D): B POS

## 2016-05-29 LAB — TYPE AND SCREEN
ABO/RH(D): B POS
Antibody Screen: NEGATIVE

## 2016-05-29 LAB — HCG, QUANTITATIVE, PREGNANCY: hCG, Beta Chain, Quant, S: 6188 m[IU]/mL — ABNORMAL HIGH (ref <5)

## 2016-05-29 SURGERY — DILATION AND EVACUATION, UTERUS
Anesthesia: General

## 2016-05-29 MED ORDER — SUCCINYLCHOLINE CHLORIDE 20 MG/ML IJ SOLN
INTRAMUSCULAR | Status: AC
  Filled 2016-05-29: qty 1

## 2016-05-29 MED ORDER — MIDAZOLAM HCL 2 MG/2ML IJ SOLN
INTRAMUSCULAR | Status: AC
  Filled 2016-05-29: qty 2

## 2016-05-29 MED ORDER — LACTATED RINGERS IV BOLUS (SEPSIS)
1000.0000 mL | Freq: Once | INTRAVENOUS | Status: AC
  Administered 2016-05-29: 1000 mL via INTRAVENOUS

## 2016-05-29 MED ORDER — FENTANYL CITRATE (PF) 250 MCG/5ML IJ SOLN
INTRAMUSCULAR | Status: AC
  Filled 2016-05-29: qty 5

## 2016-05-29 MED ORDER — LIDOCAINE HCL (CARDIAC) 20 MG/ML IV SOLN
INTRAVENOUS | Status: AC
  Filled 2016-05-29: qty 5

## 2016-05-29 MED ORDER — ONDANSETRON HCL 4 MG/2ML IJ SOLN
INTRAMUSCULAR | Status: AC
  Filled 2016-05-29: qty 2

## 2016-05-29 MED ORDER — KETOROLAC TROMETHAMINE 60 MG/2ML IM SOLN
60.0000 mg | Freq: Once | INTRAMUSCULAR | Status: AC
  Administered 2016-05-29: 60 mg via INTRAMUSCULAR
  Filled 2016-05-29: qty 2

## 2016-05-29 MED ORDER — DEXAMETHASONE SODIUM PHOSPHATE 4 MG/ML IJ SOLN
INTRAMUSCULAR | Status: AC
  Filled 2016-05-29: qty 1

## 2016-05-29 MED ORDER — PROPOFOL 10 MG/ML IV BOLUS
INTRAVENOUS | Status: AC
  Filled 2016-05-29: qty 20

## 2016-05-29 MED FILL — Medication: Qty: 1 | Status: AC

## 2016-05-29 NOTE — Progress Notes (Signed)
Pt was observed for 3 hours after episode of heaviest bleeding and fainting episode  Bleeding minimal since then Bedside US showed no significant retained POC's--347mm stripe Repeat Hgb 8.0 and pt vital signs stable Able to ambulate without problem and void.  Tolerated a regular diet.  Pt s/p probable completed AB and acute blood loss anemia D/w pt she should report ANY heavy bleeding as she is already depleted.  We discussed transfusion but as she is asymptomatic, she declines and this is reasonable provided no further heavy bleeding.  Return to office in 1-2 weeks for recheck Hgb and discuss contraception

## 2016-05-29 NOTE — H&P (Signed)
Skeet SimmerSamira Basulto is an 35 y.o. female [phone] at 3013 weeks gestation by LMP but with known missed abortion measuring 7 weeks who presented for second time in 24 hours with bleeding.  I was called emergently to MAU for pt presenting with heavy vaginal bleeding and becoming non-responsive briefly after receiving toradol.  HR dropped to 40 and pt had fainted, responded to bolus and atropine.  When I arrived pt was responding, but not really talking, so I immediately performed a speculum exam to assess her VB. Per pt and family member later, she went home from MAU yesterday and took the cytotec.  She began to bleed very heavy at 230am and at one point could not even stand because of dizziness.  She past a lot of clot, but not so sure about tissue.  Pertinent Gynecological History: OB History:  NSVD x 3   Menstrual History:  Patient's last menstrual period was 02/27/2016.    Past Medical History  Diagnosis Date  . No pertinent past medical history     Past Surgical History  Procedure Laterality Date  . No past surgeries      Family History  Problem Relation Age of Onset  . Hypertension Father   . Diabetes Father     Social History:  reports that she has never smoked. She has never used smokeless tobacco. She reports that she does not drink alcohol or use illicit drugs.  Allergies: No Known Allergies  Prescriptions prior to admission  Medication Sig Dispense Refill Last Dose  . misoprostol (CYTOTEC) 200 MCG tablet Take 4 tablets (800 mcg total) by mouth once. Place tablets between cheek and gums and let them dissolve. If you do not pass pregnancy tissue within 24 hours, take second dose. If you do not pass pregnancy tissue within 24 hours after 2nd dose call the office. 4 tablet 1   . oxyCODONE-acetaminophen (PERCOCET/ROXICET) 5-325 MG tablet Take 1 tablet by mouth every 6 (six) hours as needed for severe pain. 20 tablet 0   . Prenatal Vit-Fe Fumarate-FA (PREPLUS) 27-1 MG TABS Take 1 tablet  by mouth daily.   Past Week at Unknown time    Review of Systems  Gastrointestinal: Positive for abdominal pain.    Blood pressure 106/63, pulse 83, temperature 98.4 F (36.9 C), temperature source Oral, resp. rate 20, last menstrual period 02/27/2016, SpO2 100 %, unknown if currently breastfeeding. Physical Exam  Constitutional: She appears well-developed.  Respiratory: Effort normal.  GI: Soft.  Genitourinary:  Vagina filled with clot and cervix dilated.  Psychiatric:  Pt appears sleepy and in pain    Sterile speculum exam performed and about 800cc clot removed from vagina with an apparent gestational sac pulled from the cervix. Bleeding then slowed to minimal.     Results for orders placed or performed during the hospital encounter of 05/29/16 (from the past 24 hour(s))  CBC     Status: Abnormal   Collection Time: 05/29/16  8:15 AM  Result Value Ref Range   WBC 8.8 4.0 - 10.5 K/uL   RBC 4.25 3.87 - 5.11 MIL/uL   Hemoglobin 9.3 (L) 12.0 - 15.0 g/dL   HCT 14.729.2 (L) 82.936.0 - 56.246.0 %   MCV 68.7 (L) 78.0 - 100.0 fL   MCH 21.9 (L) 26.0 - 34.0 pg   MCHC 31.8 30.0 - 36.0 g/dL   RDW 13.015.8 (H) 86.511.5 - 78.415.5 %   Platelets 121 (L) 150 - 400 K/uL     Assessment/Plan: Pt now feeling much  improved with minimal pain after removal of gestational sac.  Unclear if any remaining tissue in fundus.  Hgb 9.3, but suspect will be much lower when equilibrates based on the amount of bleeding the pt describes at home.  Pt is stable now, and wants to see if any tissue left before proceeding with a D&C.  Will get transvaginal US and repeat Hgb/Hct 2 hours from first. D/w pt may still need a D&C if tissue remains, and may also need a transfusion if she drops significantly and is symptomatic.  Will follow closely.  Blood type from last pregnancy Rh positive.  She has been T&C.  Oliver Pila 05/29/2016, 9:37 AM

## 2016-05-29 NOTE — MAU Note (Signed)
Pt returns with report of increased bleeding. Pain and dizziness.

## 2016-05-29 NOTE — Discharge Instructions (Signed)
Anemia, Nonspecific Anemia is a condition in which the concentration of red blood cells or hemoglobin in the blood is below normal. Hemoglobin is a substance in red blood cells that carries oxygen to the tissues of the body. Anemia results in not enough oxygen reaching these tissues.  CAUSES  Common causes of anemia include:   Excessive bleeding. Bleeding may be internal or external. This includes excessive bleeding from periods (in women) or from the intestine.   Poor nutrition.   Chronic kidney, thyroid, and liver disease.  Bone marrow disorders that decrease red blood cell production.  Cancer and treatments for cancer.  HIV, AIDS, and their treatments.  Spleen problems that increase red blood cell destruction.  Blood disorders.  Excess destruction of red blood cells due to infection, medicines, and autoimmune disorders. SIGNS AND SYMPTOMS   Minor weakness.   Dizziness.   Headache.  Palpitations.   Shortness of breath, especially with exercise.   Paleness.  Cold sensitivity.  Indigestion.  Nausea.  Difficulty sleeping.  Difficulty concentrating. Symptoms may occur suddenly or they may develop slowly.  DIAGNOSIS  Additional blood tests are often needed. These help your health care provider determine the best treatment. Your health care provider will check your stool for blood and look for other causes of blood loss.  TREATMENT  Treatment varies depending on the cause of the anemia. Treatment can include:   Supplements of iron, vitamin B12, or folic acid.   Hormone medicines.   A blood transfusion. This may be needed if blood loss is severe.   Hospitalization. This may be needed if there is significant continual blood loss.   Dietary changes.  Spleen removal. HOME CARE INSTRUCTIONS Keep all follow-up appointments. It often takes many weeks to correct anemia, and having your health care provider check on your condition and your response to  treatment is very important. SEEK IMMEDIATE MEDICAL CARE IF:   You develop extreme weakness, shortness of breath, or chest pain.   You become dizzy or have trouble concentrating.  You develop heavy vaginal bleeding.   You develop a rash.   You have bloody or black, tarry stools.   You faint.   You vomit up blood.   You vomit repeatedly.   You have abdominal pain.  You have a fever or persistent symptoms for more than 2-3 days.   You have a fever and your symptoms suddenly get worse.   You are dehydrated.  MAKE SURE YOU:  Understand these instructions.  Will watch your condition.  Will get help right away if you are not doing well or get worse.   This information is not intended to replace advice given to you by your health care provider. Make sure you discuss any questions you have with your health care provider.   Document Released: 01/04/2005 Document Revised: 07/30/2013 Document Reviewed: 05/23/2013 Elsevier Interactive Patient Education 2016 [employer] Inc. Call Dr. Mindi Slicker at [facility] to make an appointment for follow up for next week.  Miscarriage  A miscarriage is the sudden loss of an unborn baby (fetus) before the 20th week of pregnancy. Most miscarriages happen in the first 3 months of pregnancy. Sometimes, it happens before a woman even knows she is pregnant. A miscarriage is also called a "spontaneous miscarriage" or "early pregnancy loss." Having a miscarriage can be an emotional experience. Talk with your caregiver about any questions you may have about miscarrying, the grieving process, and your future pregnancy plans. CAUSES   Problems with the fetal  chromosomes that make it impossible for the baby to develop normally. Problems with the baby's genes or chromosomes are most often the result of errors that occur, by chance, as the embryo divides and grows. The problems are not inherited from the parents.  Infection of the cervix or uterus.    Hormone problems.   Problems with the cervix, such as having an incompetent cervix. This is when the tissue in the cervix is not strong enough to hold the pregnancy.   Problems with the uterus, such as an abnormally shaped uterus, uterine fibroids, or congenital abnormalities.   Certain medical conditions.   Smoking, drinking alcohol, or taking illegal drugs.   Trauma.  Often, the cause of a miscarriage is unknown.  SYMPTOMS   Vaginal bleeding or spotting, with or without cramps or pain.  Pain or cramping in the abdomen or lower back.  Passing fluid, tissue, or blood clots from the vagina. DIAGNOSIS  Your caregiver will perform a physical exam. You may also have an ultrasound to confirm the miscarriage. Blood or urine tests may also be ordered. TREATMENT   Sometimes, treatment is not necessary if you naturally pass all the fetal tissue that was in the uterus. If some of the fetus or placenta remains in the body (incomplete miscarriage), tissue left behind may become infected and must be removed. Usually, a dilation and curettage (D and C) procedure is performed. During a D and C procedure, the cervix is widened (dilated) and any remaining fetal or placental tissue is gently removed from the uterus.  Antibiotic medicines are prescribed if there is an infection. Other medicines may be given to reduce the size of the uterus (contract) if there is a lot of bleeding.  If you have Rh negative blood and your baby was Rh positive, you will need a Rh immunoglobulin shot. This shot will protect any future baby from having Rh blood problems in future pregnancies. HOME CARE INSTRUCTIONS   Your caregiver may order bed rest or may allow you to continue light activity. Resume activity as directed by your caregiver.  Have someone help with home and family responsibilities during this time.   Keep track of the number of sanitary pads you use each day and how soaked (saturated) they are.  Write down this information.   Do not use tampons. Do not douche or have sexual intercourse until approved by your caregiver.   Only take over-the-counter or prescription medicines for pain or discomfort as directed by your caregiver.   Do not take aspirin. Aspirin can cause bleeding.   Keep all follow-up appointments with your caregiver.   If you or your partner have problems with grieving, talk to your caregiver or seek counseling to help cope with the pregnancy loss. Allow enough time to grieve before trying to get pregnant again.  SEEK IMMEDIATE MEDICAL CARE IF:   You have severe cramps or pain in your back or abdomen.  You have a fever.  You pass large blood clots (walnut-sized or larger) ortissue from your vagina. Save any tissue for your caregiver to inspect.   Your bleeding increases.   You have a thick, bad-smelling vaginal discharge.  You become lightheaded, weak, or you faint.   You have chills.  MAKE SURE YOU:  Understand these instructions.  Will watch your condition.  Will get help right away if you are not doing well or get worse.   This information is not intended to replace advice given to you by your health  care provider. Make sure you discuss any questions you have with your health care provider.   Document Released: 05/23/2001 Document Revised: 03/24/2013 Document Reviewed: 01/16/2012 Elsevier Interactive Patient Education [employer]

## 2016-05-29 NOTE — Anesthesia Preprocedure Evaluation (Deleted)
Anesthesia Evaluation  Patient identified by MRN, date of birth, ID band Patient awake    Reviewed: Allergy & Precautions, NPO status , Patient's Chart, lab work & pertinent test results  Airway Mallampati: II  TM Distance: >3 FB Neck ROM: Full    Dental no notable dental hx.    Pulmonary neg pulmonary ROS,    Pulmonary exam normal breath sounds clear to auscultation       Cardiovascular negative cardio ROS Normal cardiovascular exam Rhythm:Regular Rate:Normal     Neuro/Psych negative neurological ROS  negative psych ROS   GI/Hepatic negative GI ROS, Neg liver ROS,   Endo/Other  negative endocrine ROS  Renal/GU negative Renal ROS     Musculoskeletal negative musculoskeletal ROS (+)   Abdominal   Peds  Hematology negative hematology ROS (+) anemia ,   Anesthesia Other Findings   Reproductive/Obstetrics Missed AB, bleeding                             Anesthesia Physical Anesthesia Plan  ASA: II and emergent  Anesthesia Plan: General   Post-op Pain Management:    Induction: Intravenous, Rapid sequence and Cricoid pressure planned  Airway Management Planned: Oral ETT  Additional Equipment:   Intra-op Plan:   Post-operative Plan: Extubation in OR  Informed Consent: I have reviewed the patients History and Physical, chart, labs and discussed the procedure including the risks, benefits and alternatives for the proposed anesthesia with the patient or authorized representative who has indicated his/her understanding and acceptance.   Dental advisory given  Plan Discussed with: CRNA  Anesthesia Plan Comments:         Anesthesia Quick Evaluation

## 2016-05-29 NOTE — MAU Note (Signed)
Call for rapid response nurse and anes to come

## 2016-05-29 NOTE — MAU Provider Note (Signed)
History     CSN: [id number]  Arrival date and time: 05/29/16 0643   None     No chief complaint on file.  HPI    Ms.Miranda Downs is a 35 y.o. female [id number] here with heavy vaginal bleeding and abdominal pain. [facility]. Should have been 12+ weeks gestation at that visit, but ultrasound showed a 7 week fetus with no cardiac activity per prenatal record and patient. Management options were discussed and the patient took 800 mcg of buccal cytotec last night at 10 pm.  Around 0200 she started having "chuncky bleeding",  with severe abdominal cramping, passing large clots.   She took 0400 she took 1/2 pill of pan medication, however that made her feel dizzy and she does not want any more of that.   Patient requesting breakfast, and states that she would like to eat and have pain medication prior to exam. " I feel bad because I haven't eaten".   OB History    Gravida Para Term Preterm AB TAB SAB Ectopic Multiple Living   4 3 3  0 1 0 1 0 0 3      Past Medical History  Diagnosis Date  . No pertinent past medical history     Past Surgical History  Procedure Laterality Date  . No past surgeries      Family History  Problem Relation Age of Onset  . Hypertension Father   . Diabetes Father     Social History  Substance Use Topics  . Smoking status: Never Smoker   . Smokeless tobacco: Never Used  . Alcohol Use: No    Allergies: No Known Allergies  Prescriptions prior to admission  Medication Sig Dispense Refill Last Dose  . misoprostol (CYTOTEC) 200 MCG tablet Take 4 tablets (800 mcg total) by mouth once. Place tablets between cheek and gums and let them dissolve. If you do not pass pregnancy tissue within 24 hours, take second dose. If you do not pass pregnancy tissue within 24 hours after 2nd dose call the office. 4 tablet 1   . oxyCODONE-acetaminophen (PERCOCET/ROXICET) 5-325 MG tablet Take 1 tablet by mouth every 6 (six) hours as needed  for severe pain. 20 tablet 0   . Prenatal Vit-Fe Fumarate-FA (PREPLUS) 27-1 MG TABS Take 1 tablet by mouth daily.   Past Week at Unknown time   Results for orders placed or performed during the hospital encounter of 05/29/16 (from the past 48 hour(s))  CBC     Status: Abnormal   Collection Time: 05/29/16  8:15 AM  Result Value Ref Range   WBC 8.8 4.0 - 10.5 K/uL   RBC 4.25 3.87 - 5.11 MIL/uL   Hemoglobin 9.3 (L) 12.0 - 15.0 g/dL   HCT 02.729.2 (L) 25.336.0 - 66.446.0 %   MCV 68.7 (L) 78.0 - 100.0 fL   MCH 21.9 (L) 26.0 - 34.0 pg   MCHC 31.8 30.0 - 36.0 g/dL   RDW 40.315.8 (H) 47.411.5 - 25.915.5 %   Platelets 121 (L) 150 - 400 K/uL    Comment: REPEATED TO VERIFY SPECIMEN CHECKED FOR CLOTS   Type and screen [facility]     Status: None   Collection Time: 05/29/16  8:15 AM  Result Value Ref Range   ABO/RH(D) B POS    Antibody Screen NEG    Sample Expiration 06/01/2016   hCG, quantitative, pregnancy     Status: Abnormal   Collection Time: 05/29/16  8:19 AM  Result Value Ref Range   hCG, Beta Chain, Quant, S 6188 (H) <5 mIU/mL    Comment:          GEST. AGE      CONC.  (mIU/mL)   <=1 WEEK        5 - 50     2 WEEKS       50 - 500     3 WEEKS       100 - 10,000     4 WEEKS     1,000 - 30,000     5 WEEKS     3,500 - 115,000   6-8 WEEKS     12,000 - 270,000    12 WEEKS     15,000 - 220,000        FEMALE AND NON-PREGNANT FEMALE:     LESS THAN 5 mIU/mL   CBC     Status: Abnormal   Collection Time: 05/29/16 10:29 AM  Result Value Ref Range   WBC 7.9 4.0 - 10.5 K/uL   RBC 3.50 (L) 3.87 - 5.11 MIL/uL   Hemoglobin 8.0 (L) 12.0 - 15.0 g/dL   HCT 54.0 (L) 98.1 - 19.1 %   MCV 68.6 (L) 78.0 - 100.0 fL   MCH 22.9 (L) 26.0 - 34.0 pg   MCHC 33.3 30.0 - 36.0 g/dL   RDW 47.8 (H) 29.5 - 62.1 %   Platelets 176 150 - 400 K/uL   Review of Systems  Constitutional: Negative for fever and chills.  Gastrointestinal: Positive for abdominal pain.  Neurological: Positive for dizziness.   Physical  Exam   Blood pressure 92/70, pulse 104, temperature 98.4 F (36.9 C), temperature source Oral, resp. rate 20, last menstrual period 02/27/2016, SpO2 99 %, unknown if currently breastfeeding.   Patient Vitals for the past 24 hrs:  BP Temp Temp src Pulse Resp SpO2  05/29/16 0910 99/65 mmHg - - 92 - -  05/29/16 0905 110/84 mmHg - - 106 - -  05/29/16 0848 (!) 88/45 mmHg - - 95 - -  05/29/16 0735 92/70 mmHg - - 104 20 -  05/29/16 0656 102/67 mmHg 98.4 F (36.9 C) Oral 89 20 99 %   Physical Exam  Constitutional: She is oriented to person, place, and time. She appears well-developed and well-nourished. No distress.  Cardiovascular: Normal rate.   Respiratory: Effort normal.  Musculoskeletal: Normal range of motion.  Neurological: She is alert and oriented to person, place, and time.  Skin: She is not diaphoretic. There is pallor.  Psychiatric: Her behavior is normal.   MAU Course  Procedures  None  MDM  CBC Hcg Toradol 60 mg IM   Called to the room by RN staff stating that the patient was unresponsive, pale, and diaphoretic,  patient given ammonia inhalant in which she responded only minimally, patient pointing to her chest and asking [place] to call her family. Large Bore IV established,  HR 45; Atropine 0.5 mg X 1 dose given IV, Dr. Erin Fulling called to the room.  Anesthesia called to the room, Dr. Senaida Ores called and at bedside at this time. 2nd site established.   Report given to Dr. Senaida Ores who is in MAU and resumes care of the patient  Duane Lope, NP 05/29/2016 3:04 PM   Assessment and Plan

## 2016-05-29 NOTE — Progress Notes (Signed)
Printed AVS given.  Discussed anemia and blood loss with patient.  Instructed patient to call and make an appointment for next week with Dr. Mindi SlickerBanga.  Patient verbalized understanding.  Patient walked out of MAU with sister, in good condition.

## 2016-05-29 NOTE — Progress Notes (Signed)
Toradol 60mg  IM was brought in to patient for pain and given at 0830.  Patient responsive and able to turn to side for injection.  A few minutes after injection, patient started to get clammy and c/o that she felt like she needed to vomit. She also stated that she was dizzy and felt like "my heart is going to stop."  Her pulse went down to the 50s and BP was 90s/40s.  Provider was called to the bedside and and IV was started.  Atropine 0.5mg  IV was given per Venia CarbonJennifer Rasch, NP.  HR dropped to 45 at the lowest with a return to 80s/90s soon after atropine.  O2 10L via nonrebreather was given.  Sats remained 90s-100s throughout event. Dr. Erin FullingHarraway-Smith also at bedside and patient becoming more alert and able to answer questions.  Dr. Huel CoteKathy Richardson at bedside at [phone] for pelvic exam.  Dr. Senaida Oresichardson able to remove some clots and tissue and patient reported no longer feeling cramping or pain.  VSS.  Pt SpO2 100% on RA.  Plan for BS US and repeat H/H at 1015 and then reevaluate.  Patient stable and smiling at 0905.

## 2016-05-29 NOTE — Progress Notes (Signed)
Initial visit with Mrs Miranda Downs to introduce spiritual care services and offer support on the occassion of her missed miscarriage and subsequent health challenges.  She reports that the last couple of days have been very stressful.  She found out at her first OB appointment that she had lost her pregnancy a few weeks ago and was instructed to take medication to start the delivery.  She expressed some sadness that she didn't go to the doctor earlier when she discovered she was pregnant, but shared that with her other three children, no one had given her any reason to believe that that was necessary and thought it was fine to wait until she had an opportunity get an appointment. She expressed some sadness in this, but expressed that "it's okay" because the baby probably would have had some physical or mental problems if it had been able to grow to term and that she has 3 boys already.  It would appear that any grief over the pregnancy has been overshadowed by the experience of trying to deliver it.  She shared that they were in the hospital for 7 hours yesterday and returned less than 12 hours later because she was bleeding so severely.  She had a very scary moment when she was given pain medication and she shares she thought she was going to die and thought her nurse said, "we're going to lose her."  Her sister was in the cafeteria and walked in to find a terrifying scene.  She is tearful reflecting about it.  Mrs Miranda Downs stated that she was in terrible pain, but since the pregnancy has been extracted, she is feeling much better, though she jokes that she'll die from hunger.  She is in good spirits considering.  She laughingly shared that her [place] is in [place] for a job interview and offered to come right home, but she told him it wasn't necessary.  She is making jokes at present and shared that she is feeling much better despite the very difficult last few days.  Please page as further needs arise.  Miranda ShapeAmanda M.  Carley Hammedavee Downs, M.Div. [facility] Chaplain Pager [phone] Office [phone]

## 2016-12-11 NOTE — L&D Delivery Note (Signed)
Delivery Note At 3:28 PM a viable female was delivered via Vaginal, Spontaneous Delivery (Presentation: vtx; LOA ).  APGAR: 8, 9; weight pending.   Placenta status: spontaneous, intact.  Cord:  with the following complications: none.   Anesthesia:  Epidural Episiotomy: None Lacerations:  1st degree Suture Repair: 3.0 vicryl rapide Est. Blood Loss (mL):  100  Mom to postpartum.  Baby to Couplet care / Skin to Skin.  Miranda Downs 09/21/2017, 3:44 PM

## 2017-02-16 IMAGING — US US OB TRANSVAGINAL
1 series · 15 of 28 positions shown · non-contrast
Comparison: 05/28/2016 obstetric scan.

CLINICAL DATA: 34-year-old female with incomplete spontaneous
abortion on sonogram from 1 day prior, presenting for follow-up.

EXAM:
TRANSVAGINAL OB ULTRASOUND
TECHNIQUE: Transvaginal ultrasound was performed for complete evaluation of the
gestation as well as the maternal uterus, adnexal regions, and
pelvic cul-de-sac.

[Series 1: us ob transvaginal · 73 acquisitions, 15 frames shown]
[im 1/73]
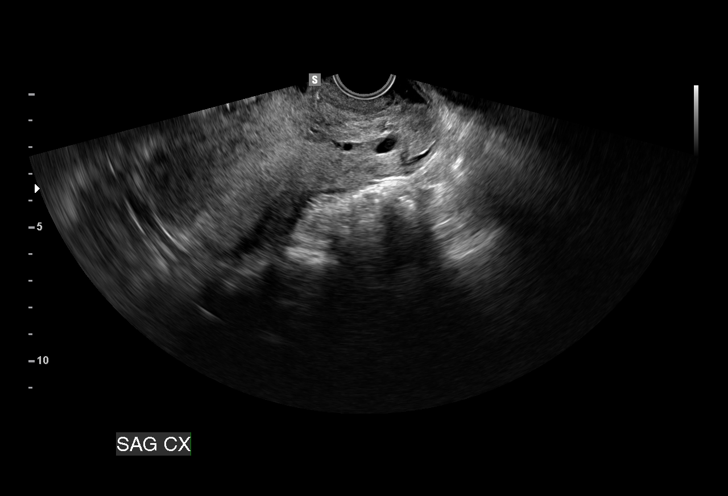
[im 6/73]
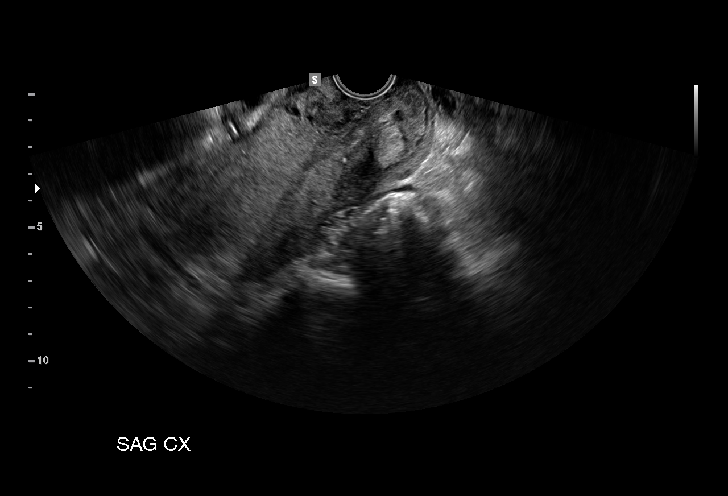
[im 11/73]
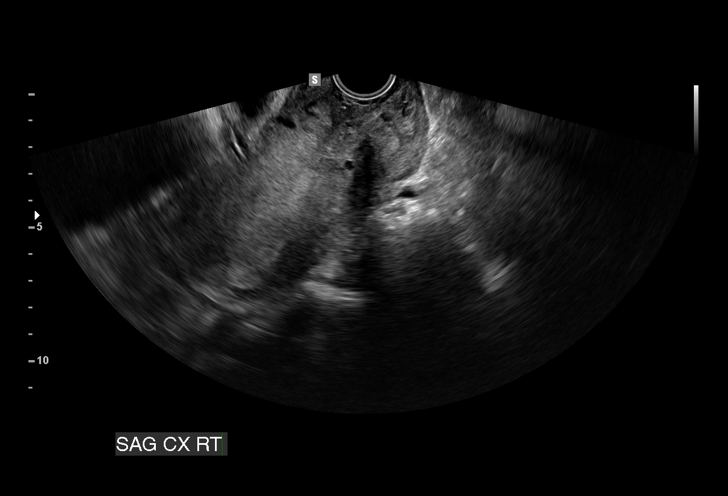
[im 17/73]
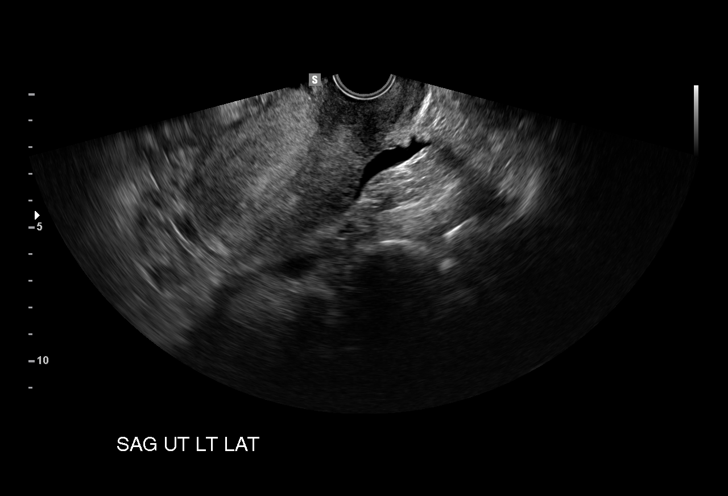
[im 22/73]
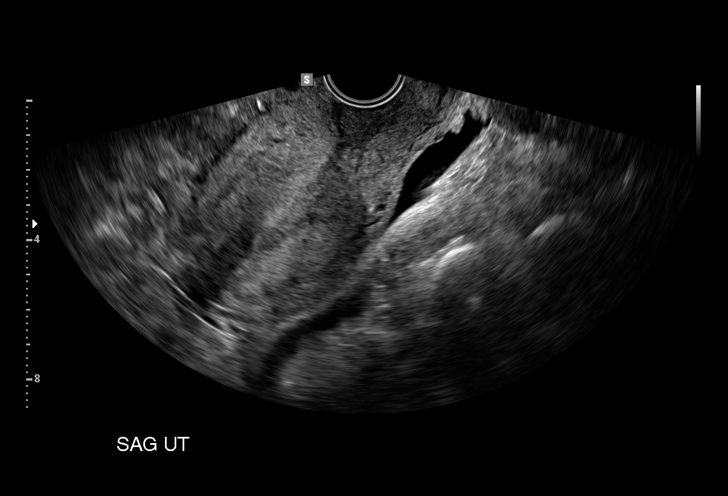
[im 27/73]
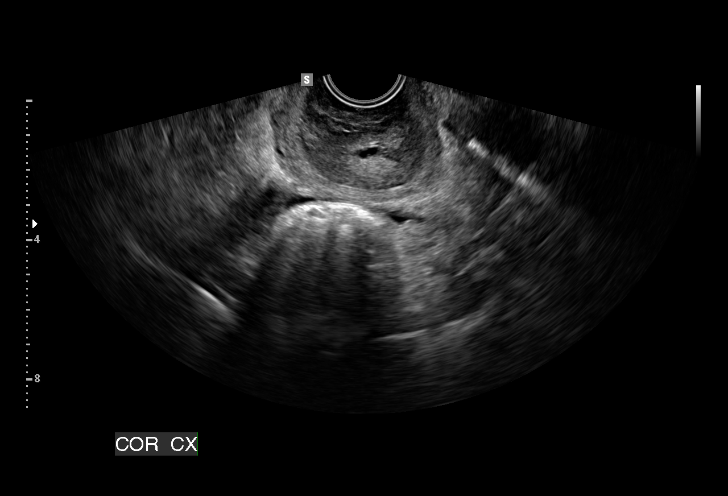
[im 33/73]
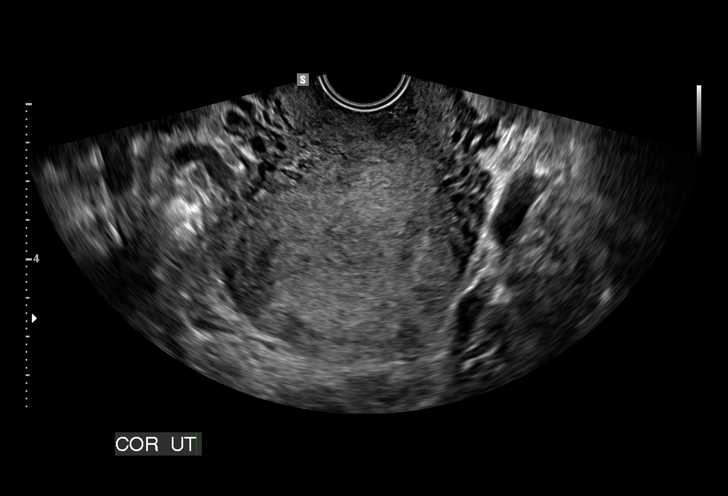
[im 38/73]
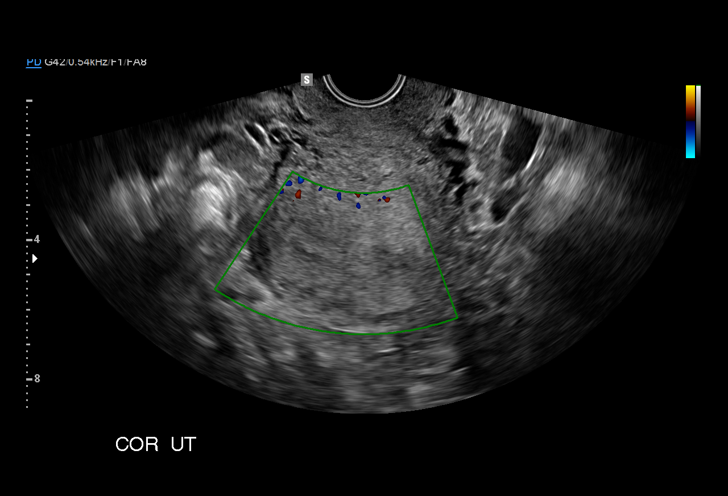
[im 41/73]
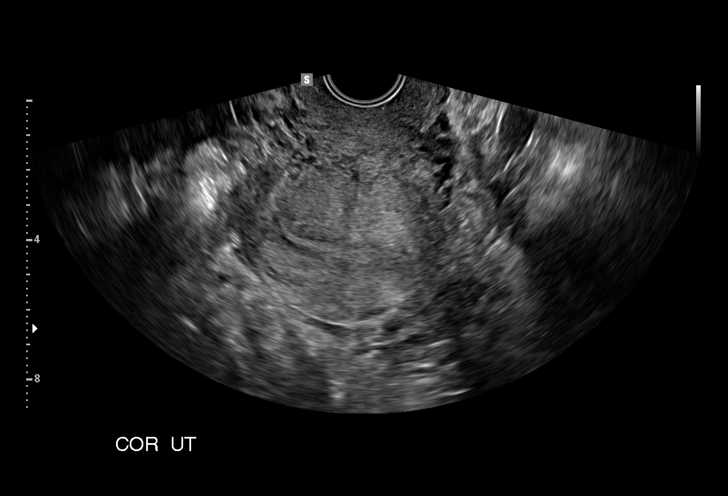
[im 46/73]
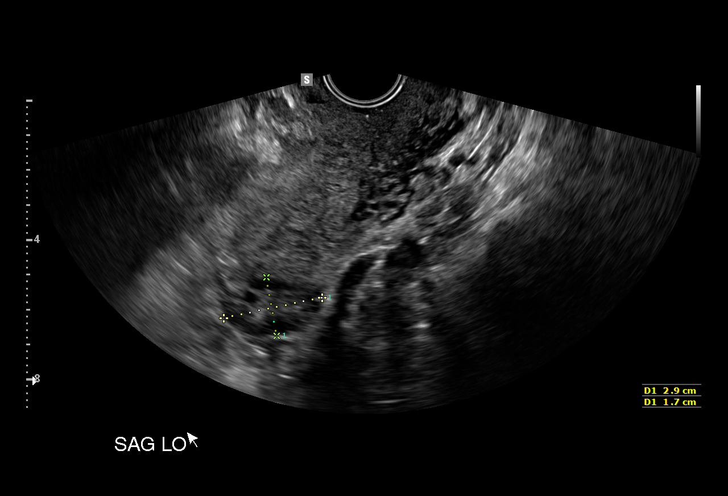
[im 51/73]
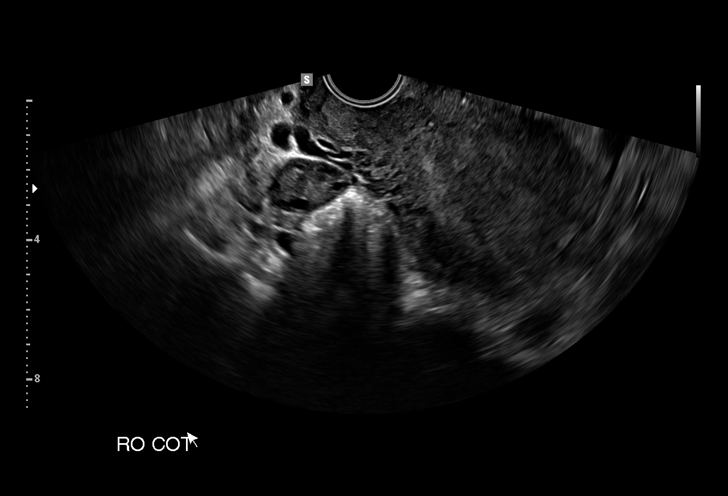
[im 57/73]
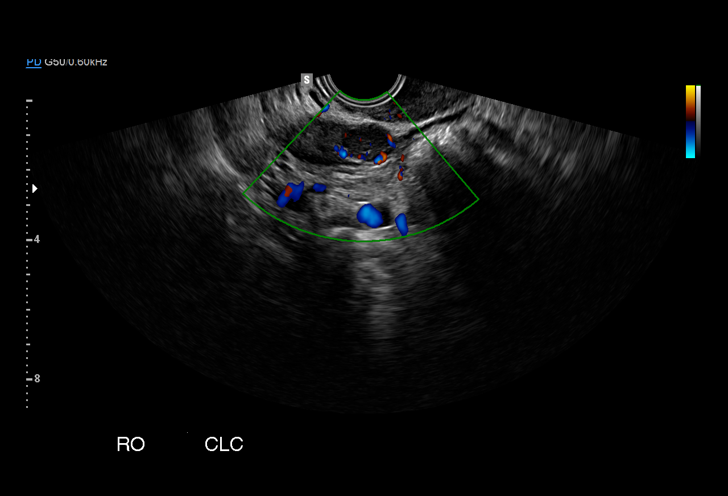
[im 62/73]
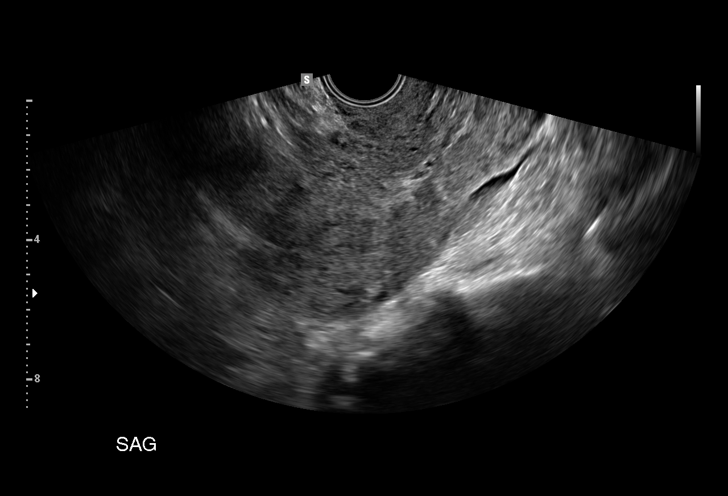
[im 67/73]
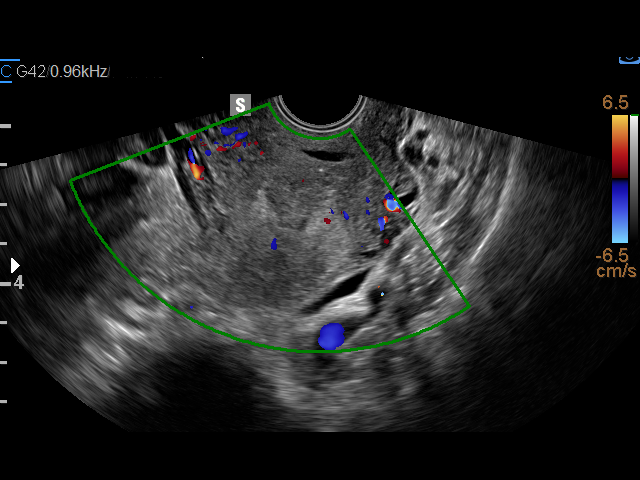
[im 73/73]
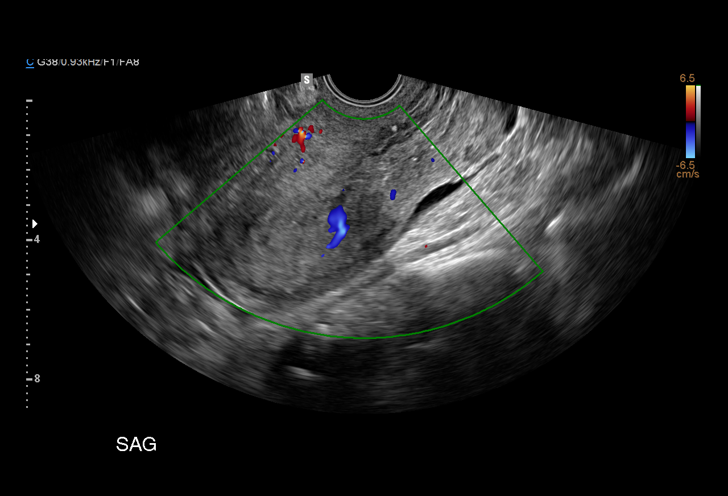

[15 of 28 positions shown; findings below may reference images not displayed]

FINDINGS: The anteverted anteflexed uterus measures 9.8 x 5.2 x 5.7 cm and is
normal in configuration. No uterine fibroids or other myometrial
abnormality.

Bilayer endometrial thickness 7 mm. Heterogeneous endometrium with
no endometrial cavity fluid or focal endometrial mass demonstrated.
No evidence of a residual gestational sac in the endometrial cavity
or endocervical canal.

Right ovary measures 4.0 x 1.6 x 2.4 cm and contains an involuting
1.2 cm corpus luteum. Left ovary measures 2.9 x 1.7 x 2.4 cm. No
suspicious ovarian or adnexal masses. No abnormal free fluid in the
pelvis.
IMPRESSION: 1. No evidence of a residual gestational sac in the endometrial
cavity or endocervical canal. Heterogeneous endometrium measuring 7
mm in bilayer thickness, consistent with endometrial blood products.
Patient is actively bleeding at the time of the scan. Findings are
consistent with an evolving spontaneous abortion in progress.
2. No suspicious ovarian or adnexal findings.

## 2017-02-23 ENCOUNTER — Encounter (HOSPITAL_COMMUNITY): Payer: Self-pay | Admitting: *Deleted

## 2017-02-23 ENCOUNTER — Inpatient Hospital Stay (HOSPITAL_COMMUNITY)
Admission: AD | Admit: 2017-02-23 | Discharge: 2017-02-23 | Disposition: A | Discharge status: Home / Self Care | Payer: Medicaid Other | Source: Ambulatory Visit | Attending: Family Medicine | Admitting: Family Medicine

## 2017-02-23 DIAGNOSIS — B349 Viral infection, unspecified: Secondary | ICD-10-CM | POA: Diagnosis not present

## 2017-02-23 DIAGNOSIS — Z3A09 9 weeks gestation of pregnancy: Secondary | ICD-10-CM | POA: Diagnosis not present

## 2017-02-23 DIAGNOSIS — B9789 Other viral agents as the cause of diseases classified elsewhere: Secondary | ICD-10-CM

## 2017-02-23 DIAGNOSIS — J310 Chronic rhinitis: Secondary | ICD-10-CM | POA: Diagnosis not present

## 2017-02-23 DIAGNOSIS — R05 Cough: Secondary | ICD-10-CM | POA: Insufficient documentation

## 2017-02-23 DIAGNOSIS — O98511 Other viral diseases complicating pregnancy, first trimester: Secondary | ICD-10-CM | POA: Diagnosis not present

## 2017-02-23 DIAGNOSIS — J069 Acute upper respiratory infection, unspecified: Secondary | ICD-10-CM | POA: Diagnosis not present

## 2017-02-23 LAB — URINALYSIS, ROUTINE W REFLEX MICROSCOPIC
Bilirubin Urine: NEGATIVE
GLUCOSE, UA: NEGATIVE mg/dL
Ketones, ur: NEGATIVE mg/dL
NITRITE: NEGATIVE
PROTEIN: NEGATIVE mg/dL
SPECIFIC GRAVITY, URINE: 1.023 (ref 1.005–1.030)
pH: 6 (ref 5.0–8.0)

## 2017-02-23 LAB — POCT PREGNANCY, URINE: PREG TEST UR: POSITIVE — AB

## 2017-02-23 MED ORDER — FLUTICASONE PROPIONATE 50 MCG/ACT NA SUSP: 1.0000 | Freq: Every day | NASAL | 2 refills | Status: DC

## 2017-02-23 MED ORDER — GUAIFENESIN ER 600 MG PO TB12: 600.0000 mg | ORAL_TABLET | Freq: Two times a day (BID) | ORAL | 1 refills | Status: DC | PRN

## 2017-02-23 NOTE — MAU Note (Signed)
Patient c/o congestion, runny nose, and non productive cough x 1week. Denies fever or chills. Also endorses left sided back pain that started today; states is sudden burst of pain that comes and goes.

## 2017-02-23 NOTE — MAU Provider Note (Signed)
History     CSN: [id number]  Arrival date and time: 02/23/17 1742   First Provider Initiated Contact with Patient 02/23/17 1833      Chief Complaint  Patient presents with  . Cough  . Nasal Congestion  . left side pain   36 year old G5 P3 at 9 weeks and 6 days who presents today complaining of rhinorrhea. She reports it is keeping her up at night causing a horrible nighttime cough and sore throat. She has gagging because of the mucus. She reports in the morning she is throwing up mucus. She denies fevers or chills. She is concerned because she had a miscarriage with her last pregnancy. She make sure everything is okay. She is asking if there is any medicines that are safe to take for her and pregnancy.    OB History    Gravida Para Term Preterm AB Living   5 3 3  0 1 3   SAB TAB Ectopic Multiple Live Births   1 0 0 0 2      Past Medical History:  Diagnosis Date  . No pertinent past medical history     Past Surgical History:  Procedure Laterality Date  . NO PAST SURGERIES      Family History  Problem Relation Age of Onset  . Hypertension Father   . Diabetes Father     Social History  Substance Use Topics  . Smoking status: Never Smoker  . Smokeless tobacco: Never Used  . Alcohol use No    Allergies: No Known Allergies  Prescriptions Prior to Admission  Medication Sig Dispense Refill Last Dose  . Prenatal Vit-Fe Fumarate-FA (PRENATAL MULTIVITAMIN) TABS tablet Take 1 tablet by mouth daily at 12 noon.   02/22/2017 at Unknown time    Review of Systems  Constitutional: Negative for chills and fever.  HENT: Positive for congestion and rhinorrhea.   Respiratory: Negative for cough and shortness of breath.   Gastrointestinal: Positive for vomiting. Negative for abdominal distention, diarrhea and nausea.  Genitourinary: Negative for difficulty urinating, dysuria and flank pain.  Neurological: Negative for dizziness and weakness.   Physical Exam   Blood pressure  130/87, pulse (!) 117, temperature 98.9 F (37.2 C), temperature source Oral, resp. rate 18, weight 194 lb 3.2 oz (88.1 kg), last menstrual period 12/16/2016, SpO2 99 %, unknown if currently breastfeeding.  Physical Exam  Constitutional: She is oriented to person, place, and time. She appears well-developed and well-nourished.  HENT:  Head: Normocephalic and atraumatic.  No tenderness to palpation over the frontal and maxillary sinuses, significant cobblestoning noted in the posterior pharynx no tonsillar swelling, mild erythema  Eyes: Conjunctivae are normal. Pupils are equal, round, and reactive to light.  Neck: Normal range of motion. Neck supple.  Cardiovascular: Normal rate, regular rhythm, normal heart sounds and intact distal pulses.   Respiratory: Effort normal and breath sounds normal. No respiratory distress. She has no wheezes. She has no rales.  Musculoskeletal: Normal range of motion. She exhibits no edema.  Lymphadenopathy:    She has no cervical adenopathy.  Neurological: She is alert and oriented to person, place, and time.  Skin: Skin is warm.  Psychiatric: She has a normal mood and affect. Her behavior is normal.    MAU Course  Procedures  MDM In MA U patient underwent thorough examination which revealed symptoms consistent with viral or allergic rhinitis. Patient is concerned about medicines to take in pregnancy. Talked about using nasal steroids to help with her rhinorrhea. Additionally  given list of medicines safe to take in pregnancy. Fetal heart tones were auscultated today at 165. Given no fever and stable vital signs likelihood of severe viral or bacterial infection is low   Urinalysis was mildly abnormal urine culture was sent. Assessment and Plan  #1: Viral syndrome with rhinitis and cough: Supportive care recommended fluticasone prescribed. Follow up as needed with OB.  Ernestina Penna 02/23/2017, 6:36 PM

## 2017-02-23 NOTE — Discharge Instructions (Signed)
Viral Respiratory Infection  A viral respiratory infection is an illness that affects parts of the body used for breathing, like the lungs, nose, and throat. It is caused by a germ called a virus.  Some examples of this kind of infection are:  · A cold.  · The flu (influenza).  · A respiratory syncytial virus (RSV) infection.    How do I know if I have this infection?  Most of the time this infection causes:  · A stuffy or runny nose.  · Yellow or green fluid in the nose.  · A cough.  · Sneezing.  · Tiredness (fatigue).  · Achy muscles.  · A sore throat.  · Sweating or chills.  · A fever.  · A headache.    How is this infection treated?  If the flu is diagnosed early, it may be treated with an antiviral medicine. This medicine shortens the length of time a person has symptoms. Symptoms may be treated with over-the-counter and prescription medicines, such as:  · Expectorants. These make it easier to cough up mucus.  · Decongestant nasal sprays.    Doctors do not prescribe antibiotic medicines for viral infections. They do not work with this kind of infection.  How do I know if I should stay home?  To keep others from getting sick, stay home if you have:  · A fever.  · A lasting cough.  · A sore throat.  · A runny nose.  · Sneezing.  · Muscles aches.  · Headaches.  · Tiredness.  · Weakness.  · Chills.  · Sweating.  · An upset stomach (nausea).    Follow these instructions at home:  · Rest as much as possible.  · Take over-the-counter and prescription medicines only as told by your doctor.  · Drink enough fluid to keep your pee (urine) clear or pale yellow.  · Gargle with salt water. Do this 3-4 times per day or as needed. To make a salt-water mixture, dissolve ½-1 tsp of salt in 1 cup of warm water. Make sure the salt dissolves all the way.  · Use nose drops made from salt water. This helps with stuffiness (congestion). It also helps soften the skin around your nose.  · Do not drink alcohol.  · Do not use tobacco  products, including cigarettes, chewing tobacco, and e-cigarettes. If you need help quitting, ask your doctor.  Get help if:  · Your symptoms last for 10 days or longer.  · Your symptoms get worse over time.  · You have a fever.  · You have very bad pain in your face or forehead.  · Parts of your jaw or neck become very swollen.  Get help right away if:  · You feel pain or pressure in your chest.  · You have shortness of breath.  · You faint or feel like you will faint.  · You keep throwing up (vomiting).  · You feel confused.  This information is not intended to replace advice given to you by your health care provider. Make sure you discuss any questions you have with your health care provider.  Document Released: 11/09/2008 Document Revised: 05/04/2016 Document Reviewed: 05/05/2015  Elsevier Interactive Patient Education © 2017 [employer]

## 2017-02-25 LAB — CULTURE, OB URINE

## 2017-08-23 LAB — OB RESULTS CONSOLE GBS: GBS: NEGATIVE

## 2017-09-16 ENCOUNTER — Encounter (HOSPITAL_COMMUNITY): Payer: Self-pay | Admitting: *Deleted

## 2017-09-16 ENCOUNTER — Inpatient Hospital Stay (HOSPITAL_COMMUNITY)
Admission: AD | Admit: 2017-09-16 | Discharge: 2017-09-16 | Disposition: A | Discharge status: Home / Self Care | Payer: Medicaid Other | Source: Ambulatory Visit | Attending: Obstetrics and Gynecology | Admitting: Obstetrics and Gynecology

## 2017-09-16 DIAGNOSIS — Z3A40 40 weeks gestation of pregnancy: Secondary | ICD-10-CM | POA: Diagnosis not present

## 2017-09-16 DIAGNOSIS — O471 False labor at or after 37 completed weeks of gestation: Secondary | ICD-10-CM

## 2017-09-16 NOTE — MAU Provider Note (Signed)
Pt seen for irregular contractions in MAU  Cervix unchanged after 1 hour 3/60/-3 ballotable FHR category 1  D/c home

## 2017-09-16 NOTE — MAU Note (Signed)
I have communicated with Dr. Senaida Ores and reviewed vital signs:  Vitals:   09/16/17 1934 09/16/17 2049  BP: (!) 107/59 (!) 117/51  Pulse: 92 77  Resp: 18   Temp: 98.2 F (36.8 C)   SpO2: 100%     Vaginal exam:  Dilation: 3.5 Effacement (%): 40 Cervical Position: Posterior Station: Ballotable Presentation: Vertex Exam by:: Jolina Symonds fields rn ,   Also reviewed contraction pattern and that non-stress test is reactive.  It has been documented that patient is contracting every None Traced minutes with no cervical change over 1 hours not indicating active labor.  Patient denies any other complaints.  Based on this report provider has given order for discharge.  A discharge order and diagnosis entered by a provider.   Labor discharge instructions reviewed with patient.

## 2017-09-16 NOTE — Discharge Instructions (Signed)
Braxton Hicks Contractions °Contractions of the uterus can occur throughout pregnancy, but they are not always a sign that you are in labor. You may have practice contractions called Braxton Hicks contractions. These false labor contractions are sometimes confused with true labor. °What are Braxton Hicks contractions? °Braxton Hicks contractions are tightening movements that occur in the muscles of the uterus before labor. Unlike true labor contractions, these contractions do not result in opening (dilation) and thinning of the cervix. Toward the end of pregnancy (32-34 weeks), Braxton Hicks contractions can happen more often and may become stronger. These contractions are sometimes difficult to tell apart from true labor because they can be very uncomfortable. You should not feel embarrassed if you go to the hospital with false labor. °Sometimes, the only way to tell if you are in true labor is for your health care provider to look for changes in the cervix. The health care provider will do a physical exam and may monitor your contractions. If you are not in true labor, the exam should show that your cervix is not dilating and your water has not broken. °If there are no prenatal problems or other health problems associated with your pregnancy, it is completely safe for you to be sent home with false labor. You may continue to have Braxton Hicks contractions until you go into true labor. °How can I tell the difference between true labor and false labor? °· Differences °? False labor °? Contractions last 30-70 seconds.: Contractions are usually shorter and not as strong as true labor contractions. °? Contractions become very regular.: Contractions are usually irregular. °? Discomfort is usually felt in the top of the uterus, and it spreads to the lower abdomen and low back.: Contractions are often felt in the front of the lower abdomen and in the groin. °? Contractions do not go away with walking.: Contractions may  go away when you walk around or change positions while lying down. °? Contractions usually become more intense and increase in frequency.: Contractions get weaker and are shorter-lasting as time goes on. °? The cervix dilates and gets thinner.: The cervix usually does not dilate or become thin. °Follow these instructions at home: °· Take over-the-counter and prescription medicines only as told by your health care provider. °· Keep up with your usual exercises and follow other instructions from your health care provider. °· Eat and drink lightly if you think you are going into labor. °· If Braxton Hicks contractions are making you uncomfortable: °? Change your position from lying down or resting to walking, or change from walking to resting. °? Sit and rest in a tub of warm water. °? Drink enough fluid to keep your urine clear or pale yellow. Dehydration may cause these contractions. °? Do slow and deep breathing several times an hour. °· Keep all follow-up prenatal visits as told by your health care provider. This is important. °Contact a health care provider if: °· You have a fever. °· You have continuous pain in your abdomen. °Get help right away if: °· Your contractions become stronger, more regular, and closer together. °· You have fluid leaking or gushing from your vagina. °· You pass blood-tinged mucus (bloody show). °· You have bleeding from your vagina. °· You have low back pain that you never had before. °· You feel your baby’s head pushing down and causing pelvic pressure. °· Your baby is not moving inside you as much as it used to. °Summary °· Contractions that occur before labor are   called Braxton Hicks contractions, false labor, or practice contractions. °· Braxton Hicks contractions are usually shorter, weaker, farther apart, and less regular than true labor contractions. True labor contractions usually become progressively stronger and regular and they become more frequent. °· Manage discomfort from  Braxton Hicks contractions by changing position, resting in a warm bath, drinking plenty of water, or practicing deep breathing. °This information is not intended to replace advice given to you by your health care provider. Make sure you discuss any questions you have with your health care provider. °Document Released: 11/27/2005 Document Revised: 10/16/2016 Document Reviewed: 10/16/2016 °Elsevier Interactive Patient Education © 2017 [employer] ° ° °Fetal Movement Counts °Patient Name: ________________________________________________ Patient Due Date: ____________________ °What is a fetal movement count? °A fetal movement count is the number of times that you feel your baby move during a certain amount of time. This may also be called a fetal kick count. A fetal movement count is recommended for every pregnant woman. You may be asked to start counting fetal movements as early as week 28 of your pregnancy. °Pay attention to when your baby is most active. You may notice your baby's sleep and wake cycles. You may also notice things that make your baby move more. You should do a fetal movement count: °· When your baby is normally most active. °· At the same time each day. ° °A good time to count movements is while you are resting, after having something to eat and drink. °How do I count fetal movements? °1. Find a quiet, comfortable area. Sit, or lie down on your side. °2. Write down the date, the start time and stop time, and the number of movements that you felt between those two times. Take this information with you to your health care visits. °3. For 2 hours, count kicks, flutters, swishes, rolls, and jabs. You should feel at least 10 movements during 2 hours. °4. You may stop counting after you have felt 10 movements. °5. If you do not feel 10 movements in 2 hours, have something to eat and drink. Then, keep resting and counting for 1 hour. If you feel at least 4 movements during that hour, you may stop  counting. °Contact a health care provider if: °· You feel fewer than 4 movements in 2 hours. °· Your baby is not moving like he or she usually does. °Date: ____________ Start time: ____________ Stop time: ____________ Movements: ____________ °Date: ____________ Start time: ____________ Stop time: ____________ Movements: ____________ °Date: ____________ Start time: ____________ Stop time: ____________ Movements: ____________ °Date: ____________ Start time: ____________ Stop time: ____________ Movements: ____________ °Date: ____________ Start time: ____________ Stop time: ____________ Movements: ____________ °Date: ____________ Start time: ____________ Stop time: ____________ Movements: ____________ °Date: ____________ Start time: ____________ Stop time: ____________ Movements: ____________ °Date: ____________ Start time: ____________ Stop time: ____________ Movements: ____________ °Date: ____________ Start time: ____________ Stop time: ____________ Movements: ____________ °This information is not intended to replace advice given to you by your health care provider. Make sure you discuss any questions you have with your health care provider. °Document Released: 12/27/2006 Document Revised: 07/26/2016 Document Reviewed: 01/06/2016 °Elsevier Interactive Patient Education © 2018 [employer]vier Inc. ° °

## 2017-09-16 NOTE — MAU Note (Signed)
Pt here with c/o contractions; denies any bleeding or leaking. Reports good fetal movement.  

## 2017-09-21 ENCOUNTER — Inpatient Hospital Stay (HOSPITAL_COMMUNITY): Payer: Medicaid Other | Admitting: Anesthesiology

## 2017-09-21 ENCOUNTER — Encounter (HOSPITAL_COMMUNITY): Payer: Self-pay

## 2017-09-21 ENCOUNTER — Inpatient Hospital Stay (HOSPITAL_COMMUNITY)
Admission: RE | Admit: 2017-09-21 | Discharge: 2017-09-23 | DRG: 807 | Disposition: A | Discharge status: Home / Self Care | Payer: Medicaid Other | Source: Ambulatory Visit | Attending: Obstetrics and Gynecology | Admitting: Obstetrics and Gynecology

## 2017-09-21 DIAGNOSIS — Z3490 Encounter for supervision of normal pregnancy, unspecified, unspecified trimester: Secondary | ICD-10-CM

## 2017-09-21 DIAGNOSIS — Z3A39 39 weeks gestation of pregnancy: Secondary | ICD-10-CM

## 2017-09-21 DIAGNOSIS — O26893 Other specified pregnancy related conditions, third trimester: Secondary | ICD-10-CM | POA: Diagnosis present

## 2017-09-21 LAB — CBC
HEMATOCRIT: 31.9 % — AB (ref 36.0–46.0)
Hemoglobin: 10.2 g/dL — ABNORMAL LOW (ref 12.0–15.0)
MCH: 22.6 pg — ABNORMAL LOW (ref 26.0–34.0)
MCHC: 32 g/dL (ref 30.0–36.0)
MCV: 70.6 fL — AB (ref 78.0–100.0)
PLATELETS: 149 10*3/uL — AB (ref 150–400)
RBC: 4.52 MIL/uL (ref 3.87–5.11)
RDW: 19 % — AB (ref 11.5–15.5)
WBC: 6.9 10*3/uL (ref 4.0–10.5)

## 2017-09-21 LAB — TYPE AND SCREEN
ABO/RH(D): B POS
Antibody Screen: NEGATIVE

## 2017-09-21 MED ORDER — SIMETHICONE 80 MG PO CHEW: 80.0000 mg | CHEWABLE_TABLET | ORAL | Status: DC | PRN

## 2017-09-21 MED ORDER — TETANUS-DIPHTH-ACELL PERTUSSIS 5-2.5-18.5 LF-MCG/0.5 IM SUSP
0.5000 mL | Freq: Once | INTRAMUSCULAR | Status: DC
  Filled 2017-09-21: qty 0.5

## 2017-09-21 MED ORDER — ONDANSETRON HCL 4 MG/2ML IJ SOLN
4.0000 mg | Freq: Four times a day (QID) | INTRAMUSCULAR | Status: DC | PRN
  Administered 2017-09-21: 4 mg via INTRAVENOUS
  Filled 2017-09-21: qty 2

## 2017-09-21 MED ORDER — OXYCODONE HCL 5 MG PO TABS: 10.0000 mg | ORAL_TABLET | ORAL | Status: DC | PRN

## 2017-09-21 MED ORDER — OXYCODONE-ACETAMINOPHEN 5-325 MG PO TABS: 1.0000 | ORAL_TABLET | ORAL | Status: DC | PRN

## 2017-09-21 MED ORDER — OXYTOCIN 40 UNITS IN LACTATED RINGERS INFUSION - SIMPLE MED
1.0000 m[IU]/min | INTRAVENOUS | Status: DC
  Administered 2017-09-21: 2 m[IU]/min via INTRAVENOUS
  Filled 2017-09-21: qty 1000

## 2017-09-21 MED ORDER — BUTORPHANOL TARTRATE 1 MG/ML IJ SOLN: 1.0000 mg | INTRAMUSCULAR | Status: DC | PRN

## 2017-09-21 MED ORDER — FENTANYL 2.5 MCG/ML BUPIVACAINE 1/10 % EPIDURAL INFUSION (WH - ANES)
14.0000 mL/h | INTRAMUSCULAR | Status: DC | PRN
  Administered 2017-09-21: 14 mL/h via EPIDURAL
  Filled 2017-09-21: qty 100

## 2017-09-21 MED ORDER — MAGNESIUM HYDROXIDE 400 MG/5ML PO SUSP: 30.0000 mL | ORAL | Status: DC | PRN

## 2017-09-21 MED ORDER — OXYCODONE HCL 5 MG PO TABS: 5.0000 mg | ORAL_TABLET | ORAL | Status: DC | PRN

## 2017-09-21 MED ORDER — LACTATED RINGERS IV SOLN
500.0000 mL | Freq: Once | INTRAVENOUS | Status: AC
  Administered 2017-09-21: 500 mL via INTRAVENOUS

## 2017-09-21 MED ORDER — BENZOCAINE-MENTHOL 20-0.5 % EX AERO
1.0000 "application " | INHALATION_SPRAY | CUTANEOUS | Status: DC | PRN
  Filled 2017-09-21: qty 56

## 2017-09-21 MED ORDER — METHYLERGONOVINE MALEATE 0.2 MG PO TABS: 0.2000 mg | ORAL_TABLET | ORAL | Status: DC | PRN

## 2017-09-21 MED ORDER — SENNOSIDES-DOCUSATE SODIUM 8.6-50 MG PO TABS
2.0000 | ORAL_TABLET | ORAL | Status: DC
  Administered 2017-09-21 – 2017-09-22 (×2): 2 via ORAL
  Filled 2017-09-21 (×2): qty 2

## 2017-09-21 MED ORDER — DIPHENHYDRAMINE HCL 50 MG/ML IJ SOLN: 12.5000 mg | INTRAMUSCULAR | Status: DC | PRN

## 2017-09-21 MED ORDER — WITCH HAZEL-GLYCERIN EX PADS: 1.0000 "application " | MEDICATED_PAD | CUTANEOUS | Status: DC | PRN

## 2017-09-21 MED ORDER — DIPHENHYDRAMINE HCL 25 MG PO CAPS: 25.0000 mg | ORAL_CAPSULE | Freq: Four times a day (QID) | ORAL | Status: DC | PRN

## 2017-09-21 MED ORDER — COCONUT OIL OIL
1.0000 "application " | TOPICAL_OIL | Status: DC | PRN
  Administered 2017-09-22: 1 via TOPICAL
  Filled 2017-09-21: qty 120

## 2017-09-21 MED ORDER — LACTATED RINGERS IV SOLN
INTRAVENOUS | Status: DC
  Administered 2017-09-21 (×2): via INTRAVENOUS

## 2017-09-21 MED ORDER — EPHEDRINE 5 MG/ML INJ
10.0000 mg | INTRAVENOUS | Status: DC | PRN
  Filled 2017-09-21: qty 2

## 2017-09-21 MED ORDER — METHYLERGONOVINE MALEATE 0.2 MG/ML IJ SOLN: 0.2000 mg | INTRAMUSCULAR | Status: DC | PRN

## 2017-09-21 MED ORDER — OXYTOCIN 40 UNITS IN LACTATED RINGERS INFUSION - SIMPLE MED: 2.5000 [IU]/h | INTRAVENOUS | Status: DC

## 2017-09-21 MED ORDER — ACETAMINOPHEN 325 MG PO TABS: 650.0000 mg | ORAL_TABLET | ORAL | Status: DC | PRN

## 2017-09-21 MED ORDER — PHENYLEPHRINE 40 MCG/ML (10ML) SYRINGE FOR IV PUSH (FOR BLOOD PRESSURE SUPPORT)
80.0000 ug | PREFILLED_SYRINGE | INTRAVENOUS | Status: DC | PRN
  Filled 2017-09-21: qty 10
  Filled 2017-09-21: qty 5

## 2017-09-21 MED ORDER — DIBUCAINE 1 % RE OINT: 1.0000 "application " | TOPICAL_OINTMENT | RECTAL | Status: DC | PRN

## 2017-09-21 MED ORDER — ACETAMINOPHEN 325 MG PO TABS
650.0000 mg | ORAL_TABLET | ORAL | Status: DC | PRN
  Administered 2017-09-22: 650 mg via ORAL
  Filled 2017-09-21: qty 2

## 2017-09-21 MED ORDER — MEASLES, MUMPS & RUBELLA VAC ~~LOC~~ INJ: 0.5000 mL | INJECTION | Freq: Once | SUBCUTANEOUS | Status: DC

## 2017-09-21 MED ORDER — OXYTOCIN BOLUS FROM INFUSION
500.0000 mL | Freq: Once | INTRAVENOUS | Status: AC
  Administered 2017-09-21: 500 mL via INTRAVENOUS

## 2017-09-21 MED ORDER — ZOLPIDEM TARTRATE 5 MG PO TABS: 5.0000 mg | ORAL_TABLET | Freq: Every evening | ORAL | Status: DC | PRN

## 2017-09-21 MED ORDER — PRENATAL MULTIVITAMIN CH
1.0000 | ORAL_TABLET | Freq: Every day | ORAL | Status: DC
  Administered 2017-09-22: 1 via ORAL
  Filled 2017-09-21: qty 1

## 2017-09-21 MED ORDER — LIDOCAINE HCL (PF) 1 % IJ SOLN
INTRAMUSCULAR | Status: DC | PRN
  Administered 2017-09-21 (×2): 4 mL

## 2017-09-21 MED ORDER — ONDANSETRON HCL 4 MG PO TABS: 4.0000 mg | ORAL_TABLET | ORAL | Status: DC | PRN

## 2017-09-21 MED ORDER — TERBUTALINE SULFATE 1 MG/ML IJ SOLN
0.2500 mg | Freq: Once | INTRAMUSCULAR | Status: DC | PRN
  Filled 2017-09-21: qty 1

## 2017-09-21 MED ORDER — LIDOCAINE HCL (PF) 1 % IJ SOLN
30.0000 mL | INTRAMUSCULAR | Status: DC | PRN
  Filled 2017-09-21: qty 30

## 2017-09-21 MED ORDER — ONDANSETRON HCL 4 MG/2ML IJ SOLN: 4.0000 mg | INTRAMUSCULAR | Status: DC | PRN

## 2017-09-21 MED ORDER — PHENYLEPHRINE 40 MCG/ML (10ML) SYRINGE FOR IV PUSH (FOR BLOOD PRESSURE SUPPORT)
80.0000 ug | PREFILLED_SYRINGE | INTRAVENOUS | Status: DC | PRN
  Filled 2017-09-21: qty 5

## 2017-09-21 MED ORDER — IBUPROFEN 600 MG PO TABS
600.0000 mg | ORAL_TABLET | Freq: Four times a day (QID) | ORAL | Status: DC
  Administered 2017-09-21 – 2017-09-23 (×7): 600 mg via ORAL
  Filled 2017-09-21 (×7): qty 1

## 2017-09-21 MED ORDER — SOD CITRATE-CITRIC ACID 500-334 MG/5ML PO SOLN: 30.0000 mL | ORAL | Status: DC | PRN

## 2017-09-21 MED ORDER — LACTATED RINGERS IV SOLN
500.0000 mL | INTRAVENOUS | Status: DC | PRN
  Administered 2017-09-21: 500 mL via INTRAVENOUS

## 2017-09-21 MED ORDER — OXYCODONE-ACETAMINOPHEN 5-325 MG PO TABS: 2.0000 | ORAL_TABLET | ORAL | Status: DC | PRN

## 2017-09-21 NOTE — Anesthesia Pain Management Evaluation Note (Signed)
  CRNA Pain Management Visit Note  Patient: Miranda Downs, 36 y.o., female  "Hello I am a member of the anesthesia team at [facility]. We have an anesthesia team available at all times to provide care throughout the hospital, including epidural management and anesthesia for C-section. I don't know your plan for the delivery whether it a natural birth, water birth, IV sedation, nitrous supplementation, doula or epidural, but we want to meet your pain goals."   1.Was your pain managed to your expectations on prior hospitalizations?   No prior hospitalizations  2.What is your expectation for pain management during this hospitalization?     Epidural  3.How can we help you reach that goal? Patient open to epidural  Would like to try natural first   Record the patient's initial score and the patient's pain goal.   Pain: 0  Pain Goal: 5 [facility] wants you to be able to say your pain was always managed very well.  Rica Records 09/21/2017

## 2017-09-21 NOTE — Anesthesia Procedure Notes (Signed)
Epidural Patient location during procedure: OB  Staffing Anesthesiologist: Aneesah Hernan Performed: anesthesiologist   Preanesthetic Checklist Completed: patient identified, pre-op evaluation, timeout performed, IV checked, risks and benefits discussed and monitors and equipment checked  Epidural Patient position: sitting Prep: site prepped and draped and DuraPrep Patient monitoring: heart rate Approach: midline Location: L3-L4 Injection technique: LOR air and LOR saline  Needle:  Needle type: Tuohy  Needle gauge: 17 G Needle length: 9 cm Needle insertion depth: 7 cm Catheter type: closed end flexible Catheter size: 19 Gauge Catheter at skin depth: 12 cm Test dose: negative  Assessment Sensory level: T8 Events: blood not aspirated, injection not painful, no injection resistance, negative IV test and no paresthesia  Additional Notes Reason for block:procedure for pain     

## 2017-09-21 NOTE — Anesthesia Preprocedure Evaluation (Signed)
Anesthesia Evaluation  Patient identified by MRN, date of birth, ID band Patient awake    Reviewed: Allergy & Precautions, NPO status , Patient's Chart, lab work & pertinent test results  Airway Mallampati: II  TM Distance: >3 FB Neck ROM: Full    Dental no notable dental hx.    Pulmonary neg pulmonary ROS,    Pulmonary exam normal breath sounds clear to auscultation       Cardiovascular negative cardio ROS Normal cardiovascular exam Rhythm:Regular Rate:Normal     Neuro/Psych negative neurological ROS  negative psych ROS   GI/Hepatic negative GI ROS, Neg liver ROS,   Endo/Other  negative endocrine ROS  Renal/GU negative Renal ROS     Musculoskeletal negative musculoskeletal ROS (+)   Abdominal   Peds  Hematology negative hematology ROS (+) anemia ,   Anesthesia Other Findings   Reproductive/Obstetrics Missed AB, bleeding                             Anesthesia Physical  Anesthesia Plan  ASA: II and emergent  Anesthesia Plan: Epidural   Post-op Pain Management:    Induction:   PONV Risk Score and Plan:   Airway Management Planned:   Additional Equipment:   Intra-op Plan:   Post-operative Plan:   Informed Consent: I have reviewed the patients History and Physical, chart, labs and discussed the procedure including the risks, benefits and alternatives for the proposed anesthesia with the patient or authorized representative who has indicated his/her understanding and acceptance.   Dental advisory given  Plan Discussed with: CRNA  Anesthesia Plan Comments:         Anesthesia Quick Evaluation

## 2017-09-21 NOTE — H&P (Signed)
Miranda Downs is a 36 y.o. female, G5 P3013, EGA 39+ weeks with EDC 10-13 presenting for elective induction with favorable cervix.  Prenatal care essentially uncomplicated, received some prenatal care in [place].  OB History    Gravida Para Term Preterm AB Living   0 1 3   SAB TAB Ectopic Multiple Live Births   1 0 0 0 3     Past Medical History:  Diagnosis Date  . No pertinent past medical history    Past Surgical History:  Procedure Laterality Date  . NO PAST SURGERIES     Family History: family history includes Diabetes in her father; Hypertension in her father. Social History:  reports that she has never smoked. She has never used smokeless tobacco. She reports that she does not drink alcohol or use drugs.     Maternal Diabetes: No Genetic Screening: Declined Maternal Ultrasounds/Referrals: Normal Fetal Ultrasounds or other Referrals:  None Maternal Substance Abuse:  No Significant Maternal Medications:  None Significant Maternal Lab Results:  Lab values include: Group B Strep negative Other Comments:  None  Review of Systems  Respiratory: Negative.   Cardiovascular: Negative.    Maternal Medical History:  Contractions: Frequency: irregular.   Perceived severity is mild.    Fetal activity: Perceived fetal activity is normal.    Prenatal complications: no prenatal complications Prenatal Complications - Diabetes: none.    Dilation: 4 Effacement (%): 70 Station: -2 Exam by:: A. Schwarz RN  Blood pressure 103/67, pulse 79, temperature 98.3 F (36.8 C), temperature source Oral, resp. rate 18, height  (1.626 m), weight 199 lb (90.3 kg), last menstrual period 12/16/2016, unknown if currently breastfeeding. Maternal Exam:  Uterine Assessment: Contraction strength is mild.  Contraction frequency is irregular.   Abdomen: Patient reports no abdominal tenderness. Estimated fetal weight is 7 1/2 lbs.   Fetal presentation: vertex  Introitus: Normal vulva.  Normal vagina.  Amniotic fluid character: not assessed.  Pelvis: adequate for delivery.   Cervix: Cervix evaluated by digital exam.     Fetal Exam Fetal Monitor Review: Mode: ultrasound.   Baseline rate: 130s.  Variability: moderate (6-25 bpm).   Pattern: accelerations present and no decelerations.    Fetal State Assessment: Category I - tracings are normal.     Physical Exam  Vitals reviewed. Constitutional: She appears well-developed and well-nourished.  Cardiovascular: Normal rate and regular rhythm.   Respiratory: Effort normal. No respiratory distress.  GI: Soft.    Prenatal labs: ABO, Rh:  B pos Antibody:  neg Rubella:  Immune RPR:   NR HBsAg:  neg  HIV:   NR GBS: Negative (09/13 0000)   Assessment/Plan: IUP at 39+ weeks with favorable cervix for induction.  On pitocin, will monitor progress, anticipate SVD.   Miranda Downs Seaver Machia 09/21/2017, 8:55 AM

## 2017-09-21 NOTE — Progress Notes (Signed)
Comfortable with epidural Afeb, VSS FHT- Cat I, ctx q 2-3 min on 10 mu/min pitocin VE-6/50/-2, vtx, AROM clear Continue pitocin, anticipate SVD

## 2017-09-22 LAB — RPR: RPR Ser Ql: NONREACTIVE

## 2017-09-22 NOTE — Plan of Care (Signed)
Problem: Education: Goal: Knowledge of condition will improve Provided patient with coconut oil for sore nipples.

## 2017-09-22 NOTE — Plan of Care (Signed)
Problem: Pain Managment: Goal: General experience of comfort will improve Outcome: Completed/Met Date Met: 09/22/17 Patient complaining of back pain near epidural site when she tries to pick up baby and moves around in bed. Discussed pain medication options that were ordered and patient declined narcotics for now and stated that tylenol would be enough for now.

## 2017-09-22 NOTE — Lactation Note (Signed)
This note was copied from a baby's chart. Lactation Consultation Note: Experienced BF mom reports this baby is having a little trouble getting wide open mouth and a deep latch- he is sliding some to tip of nipple. Encouraged to wait for wide open mouth and if he is sliding to tip to unlatch him and then latch again. She is using cradle hold- suggested using football hold or cross cradle with good pillow support.  Baby is asleep in bassinet and she does not want to try now. Reports he fed about 1 hour ago. Encouraged to call myself or RN at next feeding. BF brochure given. Reviewed our phone number, OP appointments and BFSG as resources for support after DC. No further questions at present.  Patient Name: Miranda Downs [id number] Date: 09/22/2017 Reason for consult: Initial assessment   Maternal Data Formula Feeding for Exclusion: No Has patient been taught Hand Expression?: Yes Does the patient have breastfeeding experience prior to this delivery?: Yes  Feeding Feeding Type: Breast Fed Length of feed: 10 min  LATCH Score Latch:  (enc mother to call for latch score/assist)                 Interventions Interventions: Breast feeding basics reviewed;Hand express;Expressed milk  Lactation Tools Discussed/Used     Consult Status Consult Status: Follow-up Date: 09/23/17 Follow-up type: In-patient    Pamelia Hoit 09/22/2017, 10:08 AM

## 2017-09-22 NOTE — Anesthesia Postprocedure Evaluation (Signed)
Anesthesia Post Note  Patient: Miranda Downs  Procedure(s) Performed: AN AD HOC LABOR EPIDURAL     Patient location during evaluation: Mother Baby Anesthesia Type: Epidural Level of consciousness: awake and alert and oriented Pain management: satisfactory to patient Vital Signs Assessment: post-procedure vital signs reviewed and stable Respiratory status: spontaneous breathing and nonlabored ventilation Cardiovascular status: stable Postop Assessment: no headache, no backache, no signs of nausea or vomiting, adequate PO intake and patient able to bend at knees (patient up walking) Anesthetic complications: no    Last Vitals:  Vitals:   09/21/17 2346 09/22/17 0520  BP: 96/65 96/67  Pulse: 70 72  Resp: 18 18  Temp: 36.7 C 36.8 C  SpO2: 100% 100%    Last Pain:  Vitals:   09/22/17 0520  TempSrc: Oral  PainSc:    Pain Goal: Patients Stated Pain Goal: 3 (09/22/17 0518)               Madison Hickman

## 2017-09-22 NOTE — Progress Notes (Signed)
PPD #1 No problems Afeb, VSS Fundus firm, NT at U-1 Continue routine postpartum care 

## 2017-09-23 MED ORDER — IBUPROFEN 600 MG PO TABS: 600.0000 mg | ORAL_TABLET | Freq: Four times a day (QID) | ORAL | 0 refills | Status: AC

## 2017-09-23 NOTE — Discharge Instructions (Signed)
As per discharge pamphlet °

## 2017-09-23 NOTE — Lactation Note (Signed)
This note was copied from a baby's chart. Lactation Consultation Note  Patient Name: Boy Shawnita Krizek [id number] Date: 09/23/2017 Reason for consult: Follow-up assessment;Infant weight loss;Other (Comment) (7% weight loss, milk is coming in )  Baby is 46 hours old  LC reviewed and updated doc flow sheets per mom  LC reviewed basics of breast feeding and encouraged STS feedings until the baby can stay awake for feedings.  LC repositioned baby's body for better alignment, multiple swallows noted and increased with breast compressions.  Per mom nipples have been sore and using the coconut oil and comfort gels ( given by the [facility] )  LC reminded mom the comfort gels are only for 6 days use and then discard.   LC discussed depth at the breast , and the importance of always softening the 1st breast prior to offering the 2nd breast. Sore nipple and engorgement prevention and tx reviewed.  Per mom has  Pump at home Mother informed of post-discharge support and given phone number to the lactation department, including services for phone call assistance; out-patient appointments; and breastfeeding support group. List of other breastfeeding resources in the community given in the handout. Encouraged mother to call for problems or concerns related to breastfeeding.  Maternal Data Has patient been taught Hand Expression?: Yes  Feeding Feeding Type: Breast Fed Length of feed: 8 min (multiple swallows )  LATCH Score Latch: Grasps breast easily, tongue down, lips flanged, rhythmical sucking.  Audible Swallowing: Spontaneous and intermittent  Type of Nipple: Everted at rest and after stimulation  Comfort (Breast/Nipple): Filling, red/small blisters or bruises, mild/mod discomfort  Hold (Positioning): Assistance needed to correctly position infant at breast and maintain latch.  LATCH Score: 8  Interventions Interventions: Breast feeding basics reviewed;Assisted with latch;Breast compression;Adjust  position  Lactation Tools Discussed/Used Tools: Comfort gels (reminded mom to use comfort gels only for 6 days ) [facility] Program: No   Consult Status Consult Status: Complete Date: 09/23/17    Kathrin Greathouse 09/23/2017, 9:16 AM

## 2017-09-23 NOTE — Progress Notes (Signed)
CSW received consult due to score greater than 9, or positive for SI on Edinburgh Depression Screen (MOB scored a 10).  When CSW arrived, MOB was dressed and prepared to d/c.  FOB was also present and engaged with CSW.  CSW provided education regarding Baby Blues vs PMADs. CSW encouraged MOB to evaluate her mental health throughout the postpartum period with the use of the New Mom Checklist developed by Postpartum Progress and notify a medical professional if symptoms arise. MOB and FOB were receptive to the information. MOB did not present with any acute symptoms and appeared to have insight and awareness.  There are no barriers to d/c.  Blaine Hamper, MSW, LCSW Clinical Social Work [phone]

## 2017-09-23 NOTE — Discharge Summary (Signed)
OB Discharge Summary     Patient Name: Miranda Downs DOB: [birth year] MRN: [id number]  Date of admission: 09/21/2017 Delivering MD: Jackelyn Knife, Britian Jentz   Date of discharge: 09/23/2017  Admitting diagnosis: INDUCTION Intrauterine pregnancy: [redacted]w[redacted]d     Secondary diagnosis:  Active Problems:   Term pregnancy      Discharge diagnosis: Term Pregnancy Delivered                                   Hospital course:  Induction of Labor With Vaginal Delivery   36 y.o. yo [id number] at [redacted]w[redacted]d was admitted to the hospital 09/21/2017 for induction of labor.  Indication for induction: Favorable cervix at term.  Patient had an uncomplicated labor course as follows: Membrane Rupture Time/Date: 12:20 PM ,09/21/2017   Intrapartum Procedures: Episiotomy: None [1]                                         Lacerations:  1st degree [2]  Patient had delivery of a Viable infant.  Information for the patient's newborn:  Laurianne, Floresca [[id number]]  Delivery Method: Vaginal, Spontaneous Delivery (Filed from Delivery Summary)   09/21/2017  Details of delivery can be found in separate delivery note.  Patient had a routine postpartum course. Patient is discharged home 09/23/17.  Physical exam  Vitals:   09/21/17 2346 09/22/17 0520 09/22/17 1756 09/23/17 0541  BP: 96/65 96/67 112/70 113/74  Pulse: 70 72 71 74  Resp: Temp: 98 F (36.7 C) 98.3 F (36.8 C) 98.4 F (36.9 C) 98.2 F (36.8 C)  TempSrc: Oral Oral Oral Oral  SpO2: 100% 100%  99%  Weight:      Height:       General: alert Lochia: appropriate Uterine Fundus: firm  Labs: Lab Results  Component Value Date   WBC 6.9 09/21/2017   HGB 10.2 (L) 09/21/2017   HCT 31.9 (L) 09/21/2017   MCV 70.6 (L) 09/21/2017   PLT 149 (L) 09/21/2017   CMP Latest Ref Rng & Units 01/24/2013  Glucose 70 - 99 mg/dL 130(Q)  BUN 6 - 23 mg/dL 15  Creatinine 6.57 - 8.46 mg/dL 9.62  Sodium 952 - 841 mEq/L 138  Potassium 3.5 - 5.1 mEq/L 4.5  Chloride  96 - 112 mEq/L 99  CO2 19 - 32 mEq/L 26  Calcium 8.4 - 10.5 mg/dL 10.7(H)  Total Protein 6.0 - 8.3 g/dL 3.2(G)  Total Bilirubin 0.3 - 1.2 mg/dL 4.0(N)  Alkaline Phos 39 - 117 U/L 78  AST 0 - 37 U/L 27  ALT 0 - 35 U/L 13    Discharge instruction: per After Visit Summary and "Baby and Me Booklet".  After visit meds:  Allergies as of 09/23/2017   No Known Allergies     Medication List    TAKE these medications   ibuprofen 600 MG tablet Commonly known as:  ADVIL,MOTRIN Take 1 tablet (600 mg total) by mouth every 6 (six) hours.   prenatal vitamin w/FE, FA 27-1 MG Tabs tablet Take 1 tablet by mouth daily at 12 noon.       Diet: routine diet  Activity: Advance as tolerated. Pelvic rest for 6 weeks.   Outpatient follow up:3 weeks   Newborn Data: Live born female  Birth Weight: 6 lb 14.9 oz (3145  g) APGAR: 8, 9  Newborn Delivery   Birth date/time:  09/21/2017 15:28:00 Delivery type:  Vaginal, Spontaneous Delivery      Baby Feeding: Breast Disposition:home with mother   09/23/2017 Zenaida Niece, MD

## 2017-09-23 NOTE — Progress Notes (Signed)
PPD #2 Doing well Afeb, VSS D/c home 

## 2020-04-29 ENCOUNTER — Ambulatory Visit
Admission: RE | Admit: 2020-04-29 | Discharge: 2020-04-29 | Disposition: A | Discharge status: Home / Self Care | Payer: Medicaid Other | Source: Ambulatory Visit | Attending: Family | Admitting: Family

## 2020-04-29 ENCOUNTER — Other Ambulatory Visit: Payer: Self-pay | Admitting: Family

## 2020-04-29 DIAGNOSIS — S63259A Unspecified dislocation of unspecified finger, initial encounter: Secondary | ICD-10-CM

## 2021-01-17 IMAGING — CR DG HAND COMPLETE 3+V*R*
3 series · 3 of 3 positions shown · non-contrast
Comparison: None.

CLINICAL DATA: Fourth digit pain

EXAM:
RIGHT HAND - COMPLETE 3+ VIEW

[x hand pa right]
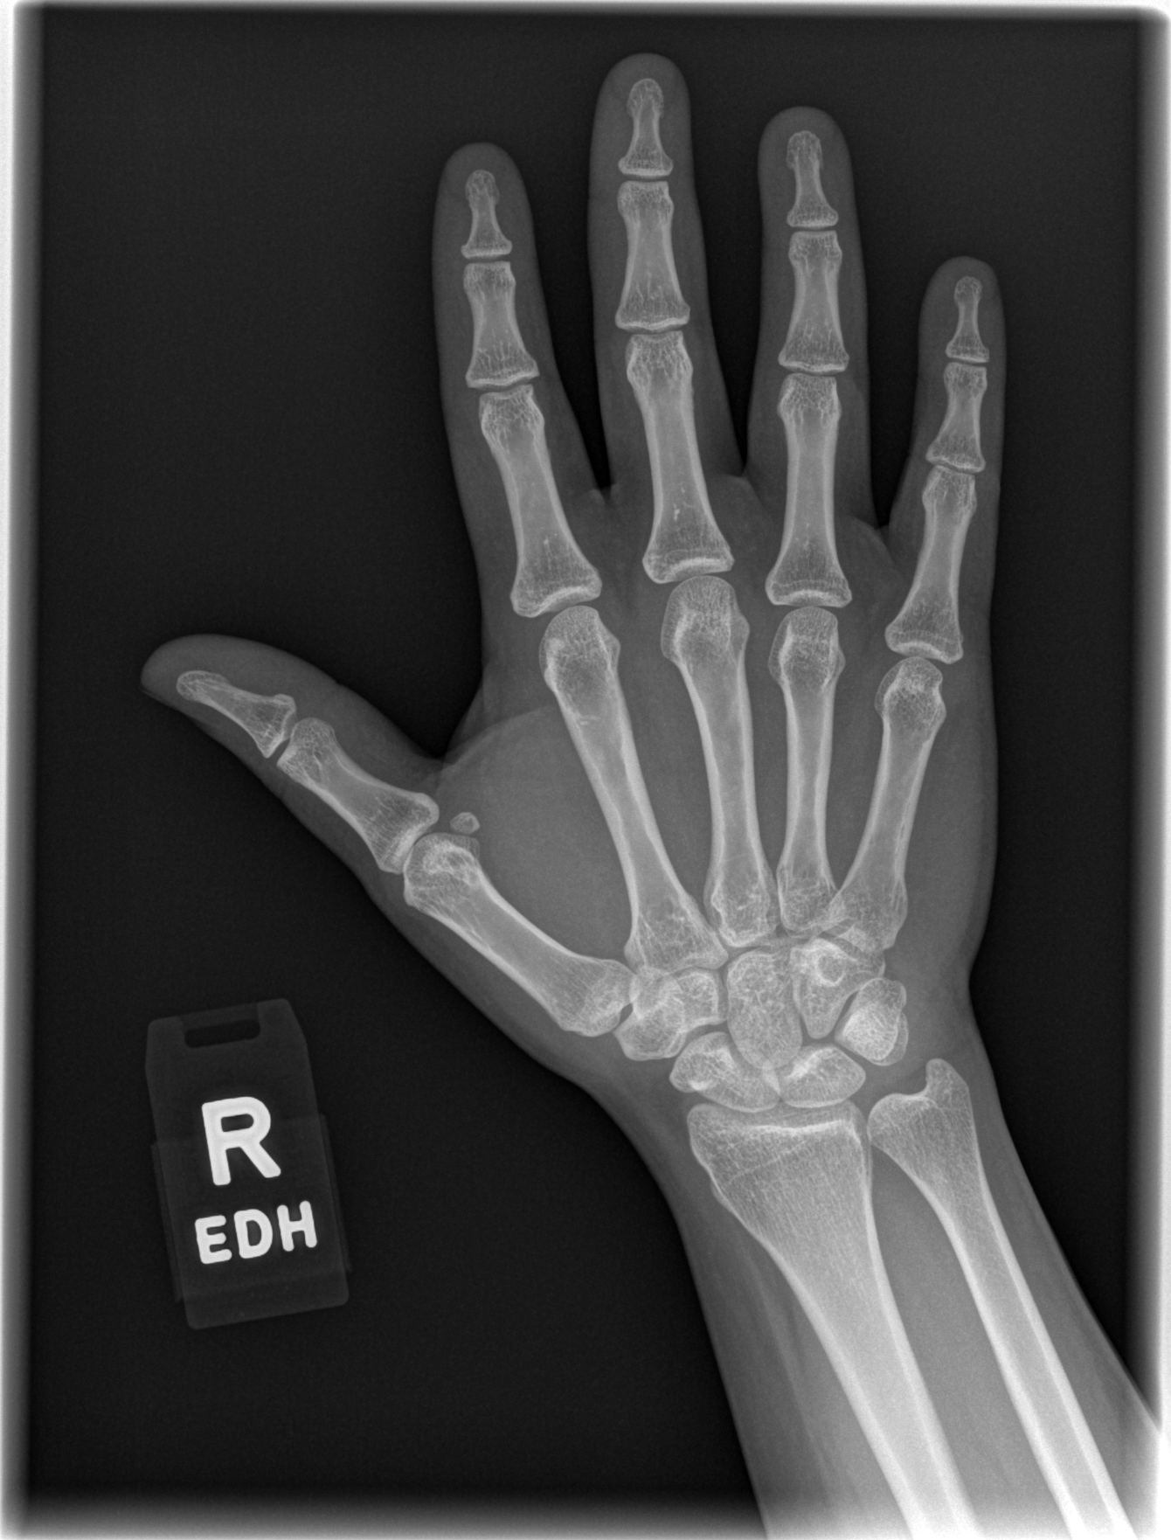

[x hand oblique right]
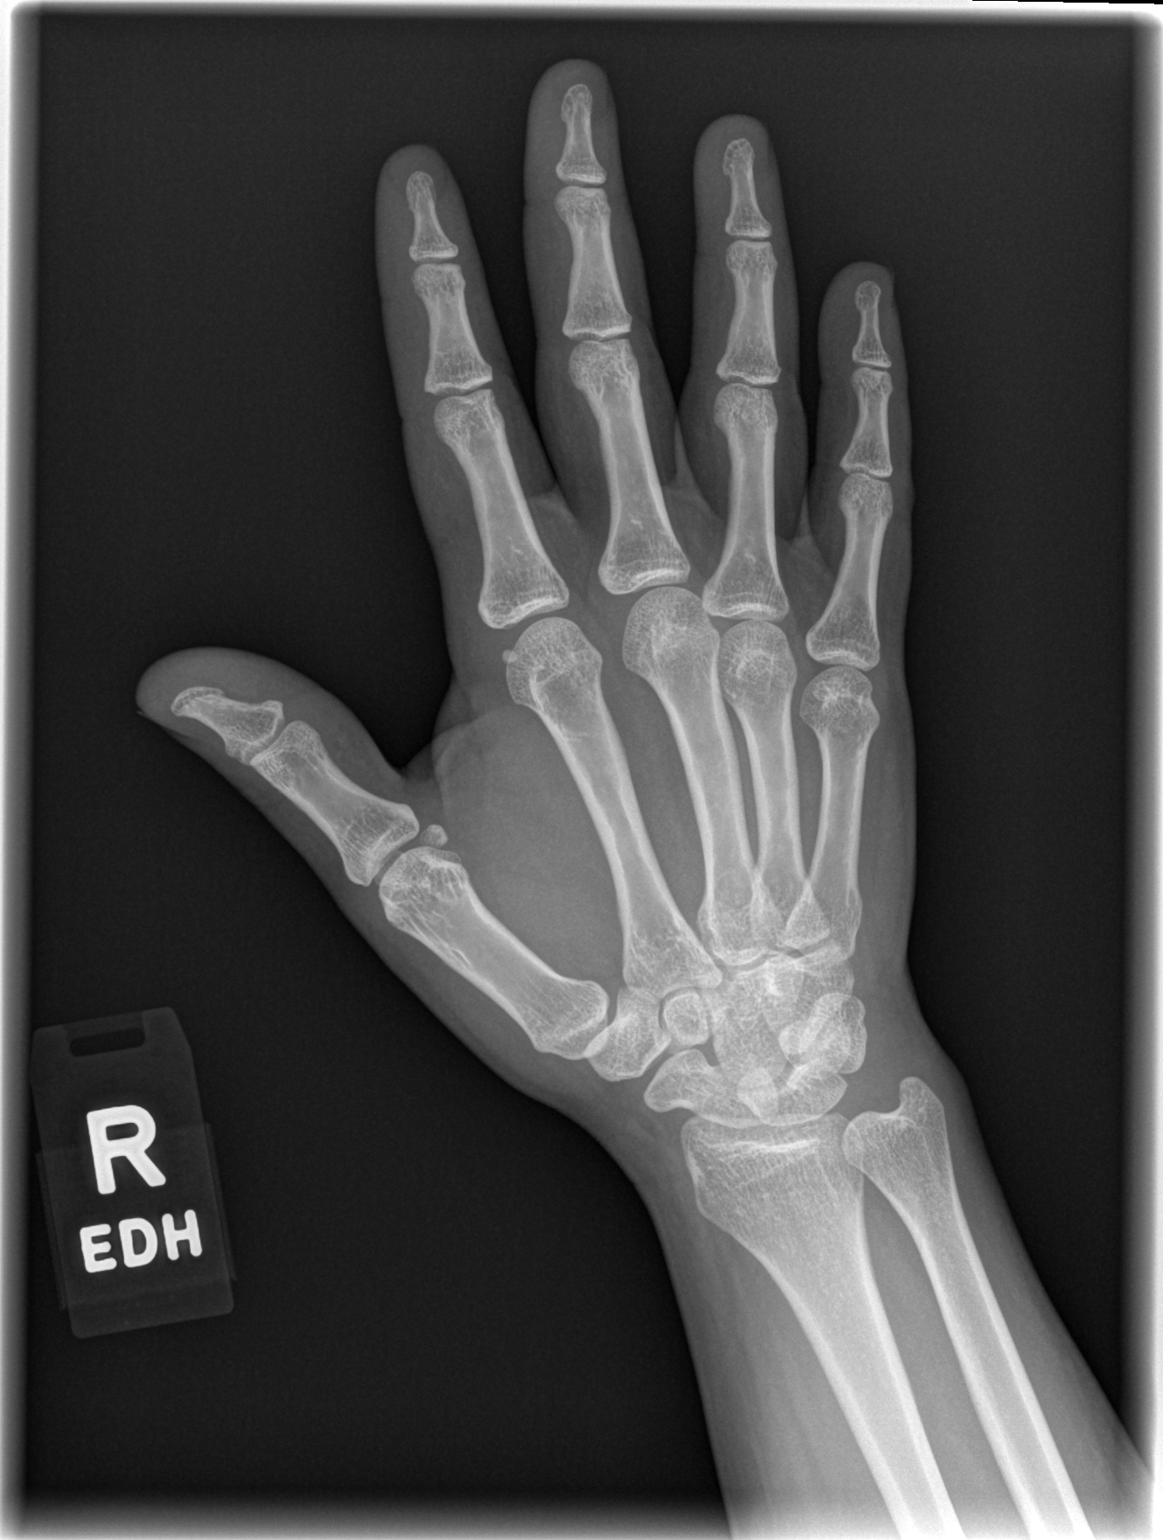

[x hand lat right]
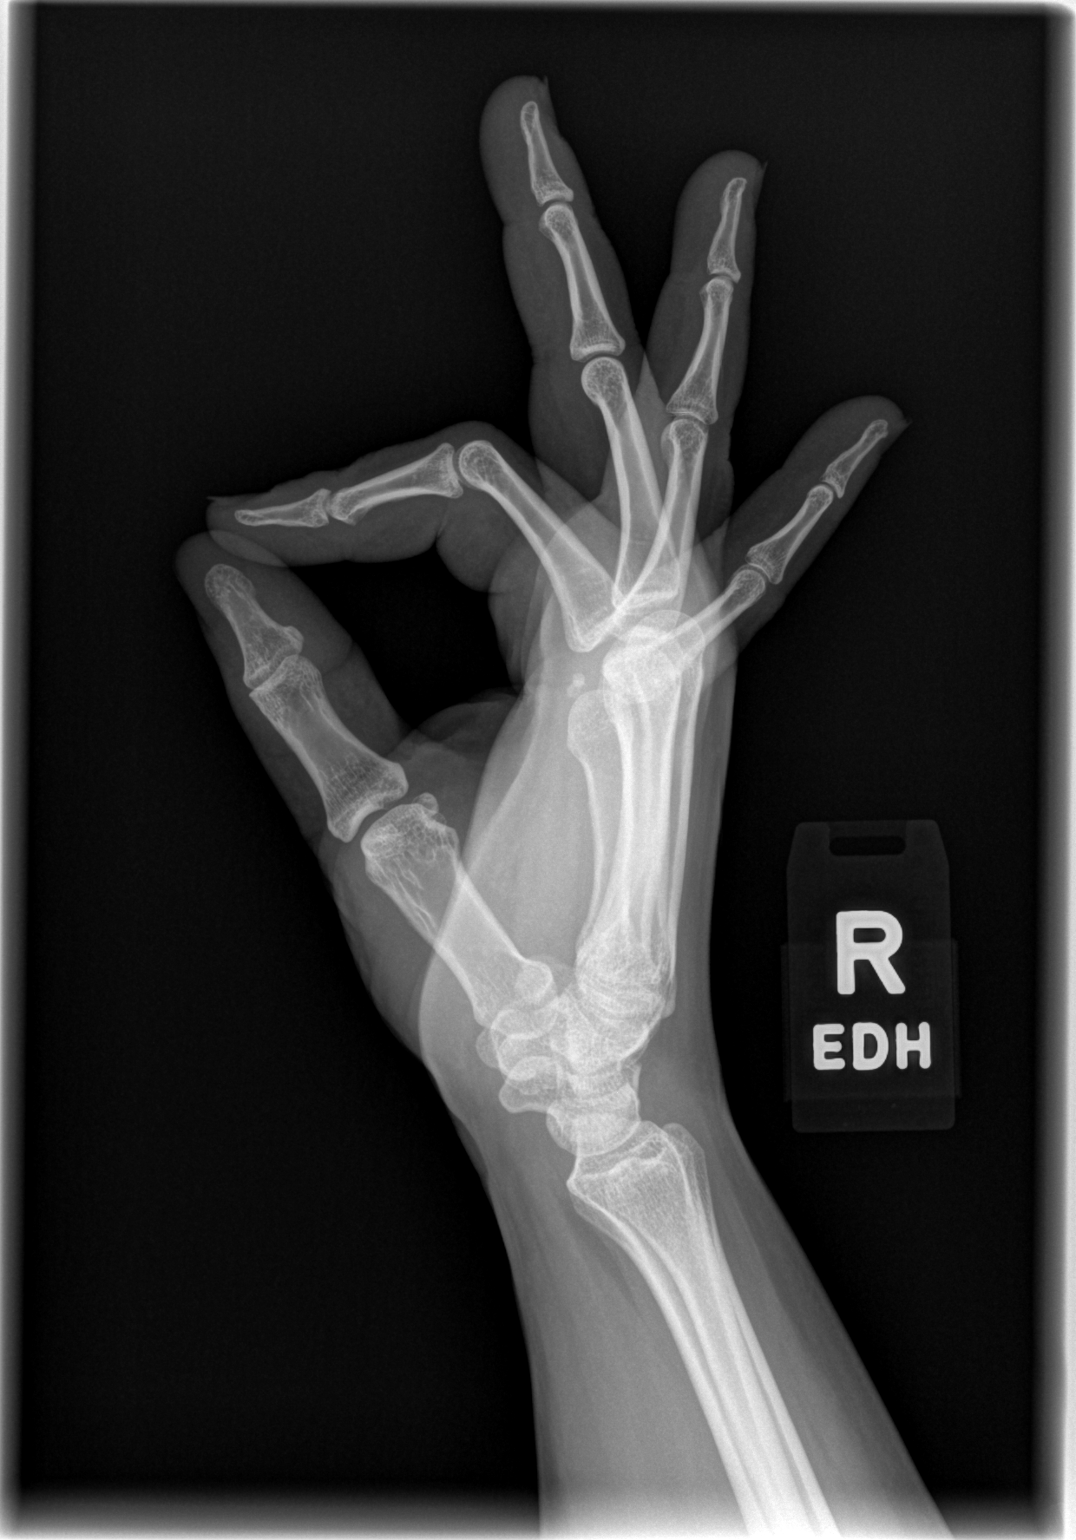

[3 of 3 positions shown; findings below may reference images not displayed]

FINDINGS: There is no evidence of fracture or dislocation. There is no
evidence of arthropathy or other focal bone abnormality. Soft
tissues are unremarkable.
IMPRESSION: Negative.

## 2021-08-02 ENCOUNTER — Encounter: Payer: Self-pay | Admitting: *Deleted

## 2021-08-02 NOTE — Congregational Nurse Program (Signed)
  Dept: [phone]   Congregational Nurse Program Note  Date of Encounter: 08/02/2021  Past Medical History: Past Medical History:  Diagnosis Date   No pertinent past medical history     Encounter Details:  CNP Questionnaire - 08/02/21 1328       Questionnaire   Do you give verbal consent to treat you today? Yes    Visit Setting Church or Organization    Location Patient Served At [facility]    Patient Status Immigrant    Medical Provider No    Insurance Medicaid    Intervention Assess (including screenings)    ED Visit Averted Yes            Client came into [facility] for BP check.  BP 106/72 HR 72.  Client looks well today and offers to complaints.  Educated client regarding normal BP and HR readings.  Client to follow up with this CN as needed.    Roderic Palau, RN, MSN, CNP [phone] Office [phone] Cell

## 2022-06-20 ENCOUNTER — Encounter: Payer: Self-pay | Admitting: *Deleted

## 2022-06-20 DIAGNOSIS — R7309 Other abnormal glucose: Secondary | ICD-10-CM

## 2022-06-20 LAB — GLUCOSE, POCT (MANUAL RESULT ENTRY): POC Glucose: 100 mg/dl — AB (ref 70–99)

## 2022-06-20 NOTE — Congregational Nurse Program (Signed)
  Dept: [phone]   Congregational Nurse Program Note  Date of Encounter: 06/20/2022  Past Medical History: Past Medical History:  Diagnosis Date   No pertinent past medical history     Encounter Details:  CNP Questionnaire - 06/20/22 1300       Questionnaire   Do you give verbal consent to treat you today? Yes    Location Patient Served  [facility]    Visit Setting Church or Organization    Patient Status Immigrant    Insurance [employer] Referral N/A    Medication N/A    Medical Provider No    Screening Referrals N/A    Medical Referral Dental    Medical Appointment Made Other    Food N/A    Transportation N/A    Housing/Utilities N/A    Interpersonal Safety N/A    Intervention Blood pressure;Blood glucose;Navigate Healthcare System;Educate    ED Visit Averted Yes    Life-Saving Intervention Made N/A            Client came into [facility] nurse clinic.  BP 114/81 HR 82 and glucose fingerstick 100.  Client has twitching in her left eye since May 2023.  Also client has a broken tooth right lower back tooth.  This CN called to verify that insurance is active; insurance does not expire until 10/10/22.  Referred client to [facility] dental clinic and client has their phone and address and she will call for appointment.  Client will call member services to see which eye doctors are in network and then she will follow up with eye doctor.  This CN will follow as needed.  Roderic Palau, RN, MSN, CNP [phone] Office [phone] Cell

## 2023-08-29 ENCOUNTER — Emergency Department (HOSPITAL_BASED_OUTPATIENT_CLINIC_OR_DEPARTMENT_OTHER)
Admission: EM | Admit: 2023-08-29 | Discharge: 2023-08-29 | Disposition: A | Discharge status: Home / Self Care | Payer: Medicaid Other | Attending: Emergency Medicine | Admitting: Emergency Medicine

## 2023-08-29 ENCOUNTER — Other Ambulatory Visit: Payer: Self-pay

## 2023-08-29 ENCOUNTER — Encounter (HOSPITAL_BASED_OUTPATIENT_CLINIC_OR_DEPARTMENT_OTHER): Payer: Self-pay | Admitting: Emergency Medicine

## 2023-08-29 DIAGNOSIS — K0889 Other specified disorders of teeth and supporting structures: Secondary | ICD-10-CM | POA: Diagnosis present

## 2023-08-29 MED ORDER — OXYCODONE HCL 5 MG PO TABS: 5.0000 mg | ORAL_TABLET | Freq: Once | ORAL | Status: DC

## 2023-08-29 MED ORDER — KETOROLAC TROMETHAMINE 15 MG/ML IJ SOLN: 15.0000 mg | Freq: Once | INTRAMUSCULAR | Status: DC

## 2023-08-29 MED ORDER — ACETAMINOPHEN 500 MG PO TABS
1000.0000 mg | ORAL_TABLET | Freq: Once | ORAL | Status: AC
  Administered 2023-08-29: 1000 mg via ORAL
  Filled 2023-08-29: qty 2

## 2023-08-29 MED ORDER — KETOROLAC TROMETHAMINE 15 MG/ML IJ SOLN
30.0000 mg | Freq: Once | INTRAMUSCULAR | Status: AC
  Administered 2023-08-29: 30 mg via INTRAMUSCULAR
  Filled 2023-08-29: qty 2

## 2023-08-29 MED ORDER — OXYCODONE HCL 5 MG PO TABS
10.0000 mg | ORAL_TABLET | Freq: Once | ORAL | Status: AC
  Administered 2023-08-29: 10 mg via ORAL
  Filled 2023-08-29: qty 2

## 2023-08-29 NOTE — ED Provider Notes (Signed)
[place] EMERGENCY DEPARTMENT AT [facility] Provider Note   CSN: [id number] Arrival date & time: 08/29/23  1959     History Chief Complaint  Patient presents with   Dental Pain    HPI Miranda Downs is a 42 y.o. female presenting for severe dental pain.  42 year old female with a minimal medical history.  Cracked a right molar yesterday has a follow-up with dentistry tomorrow.  States the pain was unbearable and she was unable to sleep tonight comes in for further care and management.  Denies fevers chills nausea vomiting syncope shortness of breath..   Patient's recorded medical, surgical, social, medication list and allergies were reviewed in the Snapshot window as part of the initial history.   Review of Systems   Review of Systems  Constitutional:  Negative for chills and fever.  HENT:  Positive for dental problem. Negative for ear pain and sore throat.   Eyes:  Negative for pain and visual disturbance.  Respiratory:  Negative for cough and shortness of breath.   Cardiovascular:  Negative for chest pain and palpitations.  Gastrointestinal:  Negative for abdominal pain and vomiting.  Genitourinary:  Negative for dysuria and hematuria.  Musculoskeletal:  Negative for arthralgias and back pain.  Skin:  Negative for color change and rash.  Neurological:  Negative for seizures and syncope.  All other systems reviewed and are negative.   Physical Exam Updated Vital Signs BP (!) 128/94 (BP Location: Right Arm)   Pulse 90   Temp 98.2 F (36.8 C) (Oral)   Resp 17   Wt 83.5 kg   LMP 08/25/2023   SpO2 100%   BMI 31.58 kg/m  Physical Exam Vitals and nursing note reviewed.  Constitutional:      General: She is not in acute distress.    Appearance: She is well-developed.  HENT:     Head: Normocephalic and atraumatic.     Mouth/Throat:     Comments: Cracked third bottom molar on the right side Eyes:     Conjunctiva/sclera: Conjunctivae normal.   Cardiovascular:     Rate and Rhythm: Normal rate and regular rhythm.     Heart sounds: No murmur heard. Pulmonary:     Effort: Pulmonary effort is normal. No respiratory distress.     Breath sounds: Normal breath sounds.  Abdominal:     General: There is no distension.     Palpations: Abdomen is soft.     Tenderness: There is no abdominal tenderness. There is no right CVA tenderness or left CVA tenderness.  Musculoskeletal:        General: No swelling or tenderness. Normal range of motion.     Cervical back: Neck supple.  Skin:    General: Skin is warm and dry.  Neurological:     General: No focal deficit present.     Mental Status: She is alert and oriented to person, place, and time. Mental status is at baseline.     Cranial Nerves: No cranial nerve deficit.      ED Course/ Medical Decision Making/ A&P    Procedures Procedures   Medications Ordered in ED Medications  acetaminophen (TYLENOL) tablet 1,000 mg (1,000 mg Oral Given 08/29/23 2217)  oxyCODONE (Oxy IR/ROXICODONE) immediate release tablet 10 mg (10 mg Oral Given 08/29/23 2217)  ketorolac (TORADOL) 15 MG/ML injection 30 mg (30 mg Intramuscular Given 08/29/23 2217)   Medical Decision Making:   Miranda Downs is a 42 y.o. female who presented to the ED today with dental  pain detailed above.    Complete initial physical exam performed, notably the patient was HDS in no acute distress. No obvious intraoral lesions or swelling. Poor dentition diffusely    Reviewed and confirmed nursing documentation for past medical history, family history, social history.    Initial Assessment:   With the patient's presentation of dental pain, most likely diagnosis is reversible vs irreversible pulpitis. Other diagnoses were considered including (but not limited to) ludwig's angina, osteitis, dental abscess, PTA, RPA. These are considered less likely due to history of present illness and physical exam findings.   This is most  consistent with an acute complicated illness  Initial Plan:  Symptomatic management with NSAIDS/Tylenol and 1 dose of oxycodone in the emergency room. Due to clinical overlap with potentially reversible pulpitis, antibiotics prescribed Patient will need definitive management with dentistry. Provided low cost/local dental resource chart provided by social work. On reassessment, patient is ambulatory tolerating p.o. intake in no acute distress.  Pain is well-controlled.  Given improvement of symptoms she is stable to follow-up with dental surgery in the morning. Disposition:  I have considered need for hospitalization, however, considering all of the above, I believe this patient is stable for discharge at this time.  Patient/family educated about specific return precautions for given chief complaint and symptoms.  Patient/family educated about follow-up with PCP and dentistry.    Patient/family expressed understanding of return precautions and need for follow-up. Patient spoken to regarding all imaging and laboratory results and appropriate follow up for these results. All education provided in verbal form with additional information in written form. Time was allowed for answering of patient questions. Patient discharged.       Clinical Impression:  1. Pain, dental      Discharge   Final Clinical Impression(s) / ED Diagnoses Final diagnoses:  Pain, dental    Rx / DC Orders ED Discharge Orders     None         Glyn Ade, MD 08/29/23 2313

## 2023-08-29 NOTE — ED Triage Notes (Signed)
Pt in with R lower mouth pain, states yesterday one of her R lower molars chipped. Sees a dentist tomorrow, but pain is unbearable in the meantime.

## 2024-11-24 ENCOUNTER — Encounter: Payer: Self-pay | Admitting: *Deleted

## 2024-11-24 LAB — GLUCOSE, POCT (MANUAL RESULT ENTRY): POC Glucose: 117 mg/dL — AB (ref 70–99)

## 2024-11-25 NOTE — Congregational Nurse Program (Signed)
°  Dept: [phone]   Congregational Nurse Program Note  Date of Encounter: 11/24/2024  Past Medical History: Past Medical History:  Diagnosis Date   No pertinent past medical history     Encounter Details:  Community Questionnaire - 11/25/24 1207       Questionnaire   Ask client: Do you give verbal consent for me to treat you today? Yes    Student Assistance N/A    Location Patient Served  [facility]    Encounter Setting CN site    Population Status Migrant/Refugee    Insurance Medicaid    Insurance/Financial Assistance Referral N/A    Medication N/A    Medical Provider No    Screening Referrals Made N/A    Medical Referrals Made N/A    Medical Appointment Completed N/A    CNP Interventions Advocate/Support;Counsel;Educate    Screenings CN Performed Blood Pressure;Blood Glucose    ED Visit Averted N/A    Life-Saving Intervention Made N/A         Client came into nurse clinic for a check up.  She has a headache and feels very stressed today.  We discussed her concerns and link.  Client is worried about an upcoming court date and is fearful of the potential outcome.  I provided support and encouragement.  Client will follow up with this CN in 2 weeks or as desired.  Lugene Ropes, RN, MSN, CNP [phone] Office [phone] Cell
